# Patient Record
Sex: Female | Born: 1995 | Race: Black or African American | Hispanic: No | Marital: Single | Smoking: Never smoker
Health system: Southern US, Community
[De-identification: ages and names within clinical notes are randomized; demographics above are authoritative.]

## PROBLEM LIST (undated history)

## (undated) ENCOUNTER — Inpatient Hospital Stay (HOSPITAL_COMMUNITY): Payer: Self-pay

## (undated) DIAGNOSIS — T7840XA Allergy, unspecified, initial encounter: Secondary | ICD-10-CM

## (undated) DIAGNOSIS — Z789 Other specified health status: Secondary | ICD-10-CM

## (undated) HISTORY — PX: NO PAST SURGERIES: SHX2092

## (undated) HISTORY — DX: Allergy, unspecified, initial encounter: T78.40XA

---

## 2014-08-11 ENCOUNTER — Ambulatory Visit (INDEPENDENT_AMBULATORY_CARE_PROVIDER_SITE_OTHER): Payer: BLUE CROSS/BLUE SHIELD | Admitting: Internal Medicine

## 2014-08-11 VITALS — BP 110/62 | HR 66 | Temp 98.2°F | Resp 18 | Ht 66.0 in | Wt 130.0 lb

## 2014-08-11 DIAGNOSIS — R51 Headache: Secondary | ICD-10-CM | POA: Diagnosis not present

## 2014-08-11 DIAGNOSIS — R519 Headache, unspecified: Secondary | ICD-10-CM

## 2014-08-11 DIAGNOSIS — R197 Diarrhea, unspecified: Secondary | ICD-10-CM | POA: Diagnosis not present

## 2014-08-11 DIAGNOSIS — R5383 Other fatigue: Secondary | ICD-10-CM

## 2014-08-11 DIAGNOSIS — R1111 Vomiting without nausea: Secondary | ICD-10-CM | POA: Diagnosis not present

## 2014-08-11 DIAGNOSIS — A084 Viral intestinal infection, unspecified: Secondary | ICD-10-CM

## 2014-08-11 MED ORDER — ONDANSETRON HCL 8 MG PO TABS
8.0000 mg | ORAL_TABLET | Freq: Three times a day (TID) | ORAL | Status: DC | PRN
Start: 2014-08-11 — End: 2016-08-28

## 2014-08-11 MED ORDER — ACETAMINOPHEN 325 MG PO TABS
1000.0000 mg | ORAL_TABLET | Freq: Once | ORAL | Status: AC
  Administered 2014-08-11: 1000 mg via ORAL

## 2014-08-11 MED ORDER — ONDANSETRON 4 MG PO TBDP
8.0000 mg | ORAL_TABLET | Freq: Once | ORAL | Status: AC
  Administered 2014-08-11: 8 mg via ORAL

## 2014-08-11 NOTE — Progress Notes (Signed)
   Subjective:    Patient ID: Shannon Fischer, female    DOB: 06-13-1995, 19 y.o.   MRN: [id number]  HPI Scribed by Felix Ahmadiallie Fransen R.T.(R)  19 year old female here for emesis, diarrhea, and fatigue since 0500 today. Pt had a headache yesterday, but otherwise felt fine. Pt has vomited around 8-10 times. Pt has had diarrhea around 8-10 times as well since this morning. Pt has complaints of epigastric abdominal pain since this morning. Pt feels a lot of fatigue. Pt has a headache today. No fever or chills. No chest pain or SOB. Pt has not had any OTC medications. Not able to keep fluids down. Has not been around any sick contacts. No dizziness or faintness. No palpitations.  Edited by CWGMD  Review of Systems  Constitutional: Positive for appetite change and fatigue. Negative for fever and chills.  Respiratory: Negative for chest tightness and shortness of breath.   Cardiovascular: Negative for chest pain and palpitations.  Gastrointestinal: Positive for vomiting, abdominal pain and diarrhea.  Musculoskeletal: Negative.   Skin: Negative.   Neurological: Positive for headaches. Negative for dizziness and light-headedness.  Psychiatric/Behavioral: Negative.        Objective:   Physical Exam  Constitutional: She is oriented to person, place, and time. She appears well-developed and well-nourished. She is active and cooperative.  Non-toxic appearance. She does not have a sickly appearance. She does not appear ill. No distress.  HENT:  Head: Normocephalic.  Nose: Nose normal.  Mouth/Throat: Oropharynx is clear and moist.  Eyes: Conjunctivae and EOM are normal. Pupils are equal, round, and reactive to light. No scleral icterus.  Neck: Normal range of motion. Neck supple.  Cardiovascular: Normal rate, regular rhythm and normal heart sounds.  Exam reveals no gallop.   No murmur heard. Pulmonary/Chest: Effort normal.  Abdominal: Soft. Bowel sounds are normal. She exhibits no distension and no  mass. There is no tenderness. There is no rebound.  Neurological: She is alert and oriented to person, place, and time. No cranial nerve deficit. She exhibits normal muscle tone. Coordination normal.  Psychiatric: She has a normal mood and affect. Her behavior is normal.   Oral zofran odt 8mg  now Clear liquids/Has kept down and no more vomiting or diarrhea last 45 minutes.  No vomiting or diarrhea over last hour.       Assessment & Plan:  Viral gastroenteritis/BRAT diet Zofran/immodium prn

## 2014-08-11 NOTE — Patient Instructions (Addendum)
Food Choices to Help Relieve Diarrhea When you have diarrhea, the foods you eat and your eating habits are very important. Choosing the right foods and drinks can help relieve diarrhea. Also, because diarrhea can last up to 7 days, you need to replace lost fluids and electrolytes (such as sodium, potassium, and chloride) in order to help prevent dehydration.  WHAT GENERAL GUIDELINES DO I NEED TO FOLLOW?  Slowly drink 1 cup (8 oz) of fluid for each episode of diarrhea. If you are getting enough fluid, your urine will be clear or pale yellow.  Eat starchy foods. Some good choices include white rice, white toast, pasta, low-fiber cereal, baked potatoes (without the skin), saltine crackers, and bagels.  Avoid large servings of any cooked vegetables.  Limit fruit to two servings per day. A serving is  cup or 1 small piece.  Choose foods with less than 2 g of fiber per serving.  Limit fats to less than 8 tsp (38 g) per day.  Avoid fried foods.  Eat foods that have probiotics in them. Probiotics can be found in certain dairy products.  Avoid foods and beverages that may increase the speed at which food moves through the stomach and intestines (gastrointestinal tract). Things to avoid include:  High-fiber foods, such as dried fruit, raw fruits and vegetables, nuts, seeds, and whole grain foods.  Spicy foods and high-fat foods.  Foods and beverages sweetened with high-fructose corn syrup, honey, or sugar alcohols such as xylitol, sorbitol, and mannitol. WHAT FOODS ARE RECOMMENDED? Grains White rice. White, French, or pita breads (fresh or toasted), including plain rolls, buns, or bagels. White pasta. Saltine, soda, or graham crackers. Pretzels. Low-fiber cereal. Cooked cereals made with water (such as cornmeal, farina, or cream cereals). Plain muffins. Matzo. Melba toast. Zwieback.  Vegetables Potatoes (without the skin). Strained tomato and vegetable juices. Most well-cooked and canned  vegetables without seeds. Tender lettuce. Fruits Cooked or canned applesauce, apricots, cherries, fruit cocktail, grapefruit, peaches, pears, or plums. Fresh bananas, apples without skin, cherries, grapes, cantaloupe, grapefruit, peaches, oranges, or plums.  Meat and Other Protein Products Baked or boiled chicken. Eggs. Tofu. Fish. Seafood. Smooth peanut butter. Ground or well-cooked tender beef, ham, veal, lamb, pork, or poultry.  Dairy Plain yogurt, kefir, and unsweetened liquid yogurt. Lactose-free milk, buttermilk, or soy milk. Plain hard cheese. Beverages Sport drinks. Clear broths. Diluted fruit juices (except prune). Regular, caffeine-free sodas such as ginger ale. Water. Decaffeinated teas. Oral rehydration solutions. Sugar-free beverages not sweetened with sugar alcohols. Other Bouillon, broth, or soups made from recommended foods.  The items listed above may not be a complete list of recommended foods or beverages. Contact your dietitian for more options. WHAT FOODS ARE NOT RECOMMENDED? Grains Whole grain, whole wheat, bran, or rye breads, rolls, pastas, crackers, and cereals. Wild or brown rice. Cereals that contain more than 2 g of fiber per serving. Corn tortillas or taco shells. Cooked or dry oatmeal. Granola. Popcorn. Vegetables Raw vegetables. Cabbage, broccoli, Brussels sprouts, artichokes, baked beans, beet greens, corn, kale, legumes, peas, sweet potatoes, and yams. Potato skins. Cooked spinach and cabbage. Fruits Dried fruit, including raisins and dates. Raw fruits. Stewed or dried prunes. Fresh apples with skin, apricots, mangoes, pears, raspberries, and strawberries.  Meat and Other Protein Products Chunky peanut butter. Nuts and seeds. Beans and lentils. Bacon.  Dairy High-fat cheeses. Milk, chocolate milk, and beverages made with milk, such as milk shakes. Cream. Ice cream. Sweets and Desserts Sweet rolls, doughnuts, and sweet breads. Pancakes   and waffles. Fats and  Oils Butter. Cream sauces. Margarine. Salad oils. Plain salad dressings. Olives. Avocados.  Beverages Caffeinated beverages (such as coffee, tea, soda, or energy drinks). Alcoholic beverages. Fruit juices with pulp. Prune juice. Soft drinks sweetened with high-fructose corn syrup or sugar alcohols. Other Coconut. Hot sauce. Chili powder. Mayonnaise. Gravy. Cream-based or milk-based soups.  The items listed above may not be a complete list of foods and beverages to avoid. Contact your dietitian for more information. WHAT SHOULD I DO IF I BECOME DEHYDRATED? Diarrhea can sometimes lead to dehydration. Signs of dehydration include dark urine and dry mouth and skin. If you think you are dehydrated, you should rehydrate with an oral rehydration solution. These solutions can be purchased at pharmacies, retail stores, or online.  Drink -1 cup (120-240 mL) of oral rehydration solution each time you have an episode of diarrhea. If drinking this amount makes your diarrhea worse, try drinking smaller amounts more often. For example, drink 1-3 tsp (5-15 mL) every 5-10 minutes.  A general rule for staying hydrated is to drink 1-2 L of fluid per day. Talk to your health care provider about the specific amount you should be drinking each day. Drink enough fluids to keep your urine clear or pale yellow. Document Released: 08/19/2003 Document Revised: 06/03/2013 Document Reviewed: 04/21/2013 [facility] Patient Information 2015 [place], [place]. This information is not intended to replace advice given to you by your health care provider. Make sure you discuss any questions you have with your health care provider. Food Choices to Help Relieve Diarrhea When you have diarrhea, the foods you eat and your eating habits are very important. Choosing the right foods and drinks can help relieve diarrhea. Also, because diarrhea can last up to 7 days, you need to replace lost fluids and electrolytes (such as sodium, potassium, and  chloride) in order to help prevent dehydration.  WHAT GENERAL GUIDELINES DO I NEED TO FOLLOW?  Slowly drink 1 cup (8 oz) of fluid for each episode of diarrhea. If you are getting enough fluid, your urine will be clear or pale yellow.  Eat starchy foods. Some good choices include white rice, white toast, pasta, low-fiber cereal, baked potatoes (without the skin), saltine crackers, and bagels.  Avoid large servings of any cooked vegetables.  Limit fruit to two servings per day. A serving is  cup or 1 small piece.  Choose foods with less than 2 g of fiber per serving.  Limit fats to less than 8 tsp (38 g) per day.  Avoid fried foods.  Eat foods that have probiotics in them. Probiotics can be found in certain dairy products.  Avoid foods and beverages that may increase the speed at which food moves through the stomach and intestines (gastrointestinal tract). Things to avoid include:  High-fiber foods, such as dried fruit, raw fruits and vegetables, nuts, seeds, and whole grain foods.  Spicy foods and high-fat foods.  Foods and beverages sweetened with high-fructose corn syrup, honey, or sugar alcohols such as xylitol, sorbitol, and mannitol. WHAT FOODS ARE RECOMMENDED? Grains White rice. White, [place], or pita breads (fresh or toasted), including plain rolls, buns, or bagels. White pasta. Saltine, soda, or graham crackers. Pretzels. Low-fiber cereal. Cooked cereals made with water (such as cornmeal, farina, or cream cereals). Plain muffins. Matzo. Melba toast. Zwieback.  Vegetables Potatoes (without the skin). Strained tomato and vegetable juices. Most well-cooked and canned vegetables without seeds. Tender lettuce. Fruits Cooked or canned applesauce, apricots, cherries, fruit cocktail, grapefruit, peaches, pears,  or plums. Fresh bananas, apples without skin, cherries, grapes, cantaloupe, grapefruit, peaches, oranges, or plums.  Meat and Other Protein Products Baked or boiled chicken.  Eggs. Tofu. Fish. Seafood. Smooth peanut butter. Ground or well-cooked tender beef, ham, veal, lamb, pork, or poultry.  Dairy Plain yogurt, kefir, and unsweetened liquid yogurt. Lactose-free milk, buttermilk, or soy milk. Plain hard cheese. Beverages Sport drinks. Clear broths. Diluted fruit juices (except prune). Regular, caffeine-free sodas such as ginger ale. Water. Decaffeinated teas. Oral rehydration solutions. Sugar-free beverages not sweetened with sugar alcohols. Other Bouillon, broth, or soups made from recommended foods.  The items listed above may not be a complete list of recommended foods or beverages. Contact your dietitian for more options. WHAT FOODS ARE NOT RECOMMENDED? Grains Whole grain, whole wheat, bran, or rye breads, rolls, pastas, crackers, and cereals. Wild or brown rice. Cereals that contain more than 2 g of fiber per serving. Corn tortillas or taco shells. Cooked or dry oatmeal. Granola. Popcorn. Vegetables Raw vegetables. Cabbage, broccoli, Brussels sprouts, artichokes, baked beans, beet greens, corn, kale, legumes, peas, sweet potatoes, and yams. Potato skins. Cooked spinach and cabbage. Fruits Dried fruit, including raisins and dates. Raw fruits. Stewed or dried prunes. Fresh apples with skin, apricots, mangoes, pears, raspberries, and strawberries.  Meat and Other Protein Products Chunky peanut butter. Nuts and seeds. Beans and lentils. Tomasa Blase.  Dairy High-fat cheeses. Milk, chocolate milk, and beverages made with milk, such as milk shakes. Cream. Ice cream. Sweets and Desserts Sweet rolls, doughnuts, and sweet breads. Pancakes and waffles. Fats and Oils Butter. Cream sauces. Margarine. Salad oils. Plain salad dressings. Olives. Avocados.  Beverages Caffeinated beverages (such as coffee, tea, soda, or energy drinks). Alcoholic beverages. Fruit juices with pulp. Prune juice. Soft drinks sweetened with high-fructose corn syrup or sugar alcohols. Other Coconut.  Hot sauce. Chili powder. Mayonnaise. Gravy. Cream-based or milk-based soups.  The items listed above may not be a complete list of foods and beverages to avoid. Contact your dietitian for more information. WHAT SHOULD I DO IF I BECOME DEHYDRATED? Diarrhea can sometimes lead to dehydration. Signs of dehydration include dark urine and dry mouth and skin. If you think you are dehydrated, you should rehydrate with an oral rehydration solution. These solutions can be purchased at pharmacies, retail stores, or online.  Drink -1 cup (120-240 mL) of oral rehydration solution each time you have an episode of diarrhea. If drinking this amount makes your diarrhea worse, try drinking smaller amounts more often. For example, drink 1-3 tsp (5-15 mL) every 5-10 minutes.  A general rule for staying hydrated is to drink 1-2 L of fluid per day. Talk to your health care provider about the specific amount you should be drinking each day. Drink enough fluids to keep your urine clear or pale yellow. Document Released: 08/19/2003 Document Revised: 06/03/2013 Document Reviewed: 04/21/2013 [facility] Patient Information 2015 [place], [place]. This information is not intended to replace advice given to you by your health care provider. Make sure you discuss any questions you have with your health care provider. Viral Gastroenteritis Viral gastroenteritis is also known as stomach flu. This condition affects the stomach and intestinal tract. It can cause sudden diarrhea and vomiting. The illness typically lasts 3 to 8 days. Most people develop an immune response that eventually gets rid of the virus. While this natural response develops, the virus can make you quite ill. CAUSES  Many different viruses can cause gastroenteritis, such as rotavirus or noroviruses. You can catch one of these  viruses by consuming contaminated food or water. You may also catch a virus by sharing utensils or other personal items with an infected person or  by touching a contaminated surface. SYMPTOMS  The most common symptoms are diarrhea and vomiting. These problems can cause a severe loss of body fluids (dehydration) and a body salt (electrolyte) imbalance. Other symptoms may include:  Fever.  Headache.  Fatigue.  Abdominal pain. DIAGNOSIS  Your caregiver can usually diagnose viral gastroenteritis based on your symptoms and a physical exam. A stool sample may also be taken to test for the presence of viruses or other infections. TREATMENT  This illness typically goes away on its own. Treatments are aimed at rehydration. The most serious cases of viral gastroenteritis involve vomiting so severely that you are not able to keep fluids down. In these cases, fluids must be given through an intravenous line (IV). HOME CARE INSTRUCTIONS   Drink enough fluids to keep your urine clear or pale yellow. Drink small amounts of fluids frequently and increase the amounts as tolerated.  Ask your caregiver for specific rehydration instructions.  Avoid:  Foods high in sugar.  Alcohol.  Carbonated drinks.  Tobacco.  Juice.  Caffeine drinks.  Extremely hot or cold fluids.  Fatty, greasy foods.  Too much intake of anything at one time.  Dairy products until 24 to 48 hours after diarrhea stops.  You may consume probiotics. Probiotics are active cultures of beneficial bacteria. They may lessen the amount and number of diarrheal stools in adults. Probiotics can be found in yogurt with active cultures and in supplements.  Wash your hands well to avoid spreading the virus.  Only take over-the-counter or prescription medicines for pain, discomfort, or fever as directed by your caregiver. Do not give aspirin to children. Antidiarrheal medicines are not recommended.  Ask your caregiver if you should continue to take your regular prescribed and over-the-counter medicines.  Keep all follow-up appointments as directed by your caregiver. SEEK  IMMEDIATE MEDICAL CARE IF:   You are unable to keep fluids down.  You do not urinate at least once every 6 to 8 hours.  You develop shortness of breath.  You notice blood in your stool or vomit. This may look like coffee grounds.  You have abdominal pain that increases or is concentrated in one small area (localized).  You have persistent vomiting or diarrhea.  You have a fever.  The patient is a child younger than 3 months, and he or she has a fever.  The patient is a child older than 3 months, and he or she has a fever and persistent symptoms.  The patient is a child older than 3 months, and he or she has a fever and symptoms suddenly get worse.  The patient is a baby, and he or she has no tears when crying. MAKE SURE YOU:   Understand these instructions.  Will watch your condition.  Will get help right away if you are not doing well or get worse. Document Released: 05/29/2005 Document Revised: 08/21/2011 Document Reviewed: 03/15/2011 [facility] Patient Information 2015 [place], [place]. This information is not intended to replace advice given to you by your health care provider. Make sure you discuss any questions you have with your health care provider.

## 2015-03-26 ENCOUNTER — Ambulatory Visit (INDEPENDENT_AMBULATORY_CARE_PROVIDER_SITE_OTHER): Payer: BLUE CROSS/BLUE SHIELD | Admitting: Emergency Medicine

## 2015-03-26 VITALS — BP 104/62 | HR 68 | Temp 97.7°F | Resp 16 | Ht 65.0 in | Wt 129.2 lb

## 2015-03-26 DIAGNOSIS — J0140 Acute pansinusitis, unspecified: Secondary | ICD-10-CM

## 2015-03-26 MED ORDER — AMOXICILLIN-POT CLAVULANATE 875-125 MG PO TABS: 1.0000 | ORAL_TABLET | Freq: Two times a day (BID) | ORAL | Status: DC

## 2015-03-26 MED ORDER — PSEUDOEPHEDRINE-GUAIFENESIN ER 60-600 MG PO TB12: 1.0000 | ORAL_TABLET | Freq: Two times a day (BID) | ORAL | Status: AC

## 2015-03-26 NOTE — Progress Notes (Signed)
Subjective:  Patient ID: Shannon Fischer, female    DOB: 08-19-1995  Age: 19 y.o. MRN: [id number]  CC: Headache; Sore Throat; and Generalized Body Aches   HPI [name] presents  with sore throat. She said I was treated in the past for tonsillitis were felt this way. She has no fever or chills. She has some difficulty swallowing due to pain. She has postnasal drainage and nasal congestion. She spits out her green nasal drainage. She has no cough or nausea or vomiting. No other complaints. She has no improvement with over-the-counter medication  History [name] has a past medical history of Allergy.   She has no past surgical history on file.   Her  family history includes Cancer in her maternal grandfather and paternal grandmother; Diabetes in her paternal grandfather.  She   reports that she has never smoked. She does not have any smokeless tobacco history on file. She reports that she does not drink alcohol or use illicit drugs.  Outpatient Prescriptions Prior to Visit  Medication Sig Dispense Refill  . Norethin Ace-Eth Estrad-FE 1-20 MG-MCG(24) CHEW Chew by mouth.    . ondansetron (ZOFRAN) 8 MG tablet Take 1 tablet (8 mg total) by mouth every 8 (eight) hours as needed for nausea or vomiting. (Patient not taking: Reported on 03/26/2015) 20 tablet 0   No facility-administered medications prior to visit.    Social History   Social History  . Marital Status: Single    Spouse Name: N/A  . Number of Children: N/A  . Years of Education: N/A   Social History Main Topics  . Smoking status: Never Smoker   . Smokeless tobacco: None  . Alcohol Use: No  . Drug Use: No  . Sexual Activity: Not Asked   Other Topics Concern  . None   Social History Narrative     Review of Systems  Constitutional: Positive for fatigue. Negative for fever, chills and appetite change.  HENT: Positive for postnasal drip and sore throat. Negative for congestion, ear pain and sinus pressure.     Eyes: Negative for pain and redness.  Respiratory: Negative for cough, shortness of breath and wheezing.   Cardiovascular: Negative for leg swelling.  Gastrointestinal: Negative for nausea, vomiting, abdominal pain, diarrhea, constipation and blood in stool.  Endocrine: Negative for polyuria.  Genitourinary: Negative for dysuria, urgency, frequency and flank pain.  Musculoskeletal: Negative for gait problem.  Skin: Negative for rash.  Neurological: Negative for weakness and headaches.  Psychiatric/Behavioral: Negative for confusion and decreased concentration. The patient is not nervous/anxious.     Objective:  BP 104/62 mmHg  Pulse 68  Temp(Src) 97.7 F (36.5 C) (Oral)  Resp 16  Ht  (1.651 m)  Wt 129 lb 3.2 oz (58.605 kg)  BMI 21.50 kg/m2  LMP 02/26/2015  Physical Exam  Constitutional: She is oriented to person, place, and time. She appears well-developed and well-nourished. No distress.  HENT:  Head: Normocephalic and atraumatic.  Right Ear: External ear normal.  Left Ear: External ear normal.  Nose: Nose normal.  Eyes: Conjunctivae and EOM are normal. Pupils are equal, round, and reactive to light. No scleral icterus.  Neck: Normal range of motion. Neck supple. No tracheal deviation present.  Cardiovascular: Normal rate, regular rhythm and normal heart sounds.   Pulmonary/Chest: Effort normal. No respiratory distress. She has no wheezes. She has no rales.  Abdominal: She exhibits no mass. There is no tenderness. There is no rebound and no guarding.  Musculoskeletal: She  exhibits no edema.  Lymphadenopathy:    She has no cervical adenopathy.  Neurological: She is alert and oriented to person, place, and time. Coordination normal.  Skin: Skin is warm and dry. No rash noted.  Psychiatric: She has a normal mood and affect. Her behavior is normal.      Assessment & Plan:   Luanna ColeBriana was seen today for headache, sore throat and generalized body aches.  Diagnoses and  all orders for this visit:  Acute pansinusitis, recurrence not specified  Other orders -     amoxicillin-clavulanate (AUGMENTIN) 875-125 MG tablet; Take 1 tablet by mouth 2 (two) times daily. -     pseudoephedrine-guaifenesin (MUCINEX D) 60-600 MG 12 hr tablet; Take 1 tablet by mouth every 12 (twelve) hours.  I am having Ms. Raver start on amoxicillin-clavulanate and pseudoephedrine-guaifenesin. I am also having her maintain her Norethin Ace-Eth Estrad-FE and ondansetron.  Meds ordered this encounter  Medications  . amoxicillin-clavulanate (AUGMENTIN) 875-125 MG tablet    Sig: Take 1 tablet by mouth 2 (two) times daily.    Dispense:  20 tablet    Refill:  0  . pseudoephedrine-guaifenesin (MUCINEX D) 60-600 MG 12 hr tablet    Sig: Take 1 tablet by mouth every 12 (twelve) hours.    Dispense:  18 tablet    Refill:  0    Appropriate red flag conditions were discussed with the patient as well as actions that should be taken.  Patient expressed his understanding.  Follow-up: Return if symptoms worsen or fail to improve.  Carmelina DaneAnderson, Jeffery S, MD

## 2015-03-26 NOTE — Patient Instructions (Signed)

## 2016-05-09 DIAGNOSIS — N912 Amenorrhea, unspecified: Secondary | ICD-10-CM | POA: Diagnosis not present

## 2016-05-09 DIAGNOSIS — Z3201 Encounter for pregnancy test, result positive: Secondary | ICD-10-CM | POA: Diagnosis not present

## 2016-05-15 DIAGNOSIS — O021 Missed abortion: Secondary | ICD-10-CM | POA: Diagnosis not present

## 2016-05-31 DIAGNOSIS — O021 Missed abortion: Secondary | ICD-10-CM | POA: Diagnosis not present

## 2016-06-07 DIAGNOSIS — O021 Missed abortion: Secondary | ICD-10-CM | POA: Diagnosis not present

## 2016-06-12 NOTE — L&D Delivery Note (Signed)
Delivery Note Pt reached complete dilation +3 station and pushed well for about 30 minutes.  At 1:16 AM a healthy female was delivered via Vaginal, Spontaneous (Presentation: ROA  ).  APGAR: 9, 9; weight pending .   Placenta status: delivered spontaneously with fundal massage .  Cord:  with the following complications:short   Anesthesia:  epidural Episiotomy: None Lacerations: Periurethral abrasions repaired for hemostasis Suture Repair: 3.0 vicryl rapide Est. Blood Loss (mL): 175  Mom to postpartum.  Baby to Couplet care / Skin to Skin.  Oliver PilaKathy W Theodora Lalanne 05/24/2017, 1:42 AM

## 2016-06-21 ENCOUNTER — Ambulatory Visit (INDEPENDENT_AMBULATORY_CARE_PROVIDER_SITE_OTHER): Payer: BLUE CROSS/BLUE SHIELD | Admitting: Emergency Medicine

## 2016-06-21 VITALS — BP 96/72 | HR 87 | Temp 97.8°F | Resp 18 | Ht 65.0 in | Wt 138.0 lb

## 2016-06-21 DIAGNOSIS — J3489 Other specified disorders of nose and nasal sinuses: Secondary | ICD-10-CM

## 2016-06-21 DIAGNOSIS — J069 Acute upper respiratory infection, unspecified: Secondary | ICD-10-CM | POA: Diagnosis not present

## 2016-06-21 DIAGNOSIS — R0981 Nasal congestion: Secondary | ICD-10-CM | POA: Diagnosis not present

## 2016-06-21 MED ORDER — PSEUDOEPHEDRINE-GUAIFENESIN ER 60-600 MG PO TB12: 1.0000 | ORAL_TABLET | Freq: Two times a day (BID) | ORAL | 0 refills | Status: AC

## 2016-06-21 MED ORDER — CETIRIZINE HCL 10 MG PO TABS: 10.0000 mg | ORAL_TABLET | Freq: Every day | ORAL | 11 refills | Status: DC

## 2016-06-21 MED ORDER — PREDNISONE 20 MG PO TABS: 20.0000 mg | ORAL_TABLET | Freq: Every day | ORAL | 0 refills | Status: AC

## 2016-06-21 MED ORDER — TRIAMCINOLONE ACETONIDE 55 MCG/ACT NA AERO
2.0000 | INHALATION_SPRAY | Freq: Every day | NASAL | 2 refills | Status: DC
Start: 2016-06-21 — End: 2017-01-14

## 2016-06-21 NOTE — Progress Notes (Signed)
Shannon Fischer 21 y.o.   Chief Complaint  Patient presents with  . Epistaxis  . Nasal Congestion    runny nose  . Cough    HISTORY OF PRESENT ILLNESS: This is a 21 y.o. female complaining of runny nose, nasal congestion, and watery itchy eyes for 5-6 days. URI   This is a new problem. The current episode started in the past 7 days. The problem has been waxing and waning. There has been no fever. Associated symptoms include congestion, coughing, rhinorrhea, sinus pain, sneezing and a sore throat. Pertinent negatives include no abdominal pain, chest pain, diarrhea, dysuria, ear pain, headaches, nausea, rash, vomiting or wheezing. She has tried decongestant (Sudafed) for the symptoms. The treatment provided mild relief.     Prior to Admission medications   Medication Sig Start Date End Date Taking? Authorizing Provider  amoxicillin-clavulanate (AUGMENTIN) 875-125 MG tablet Take 1 tablet by mouth 2 (two) times daily. Patient not taking: Reported on 06/21/2016 03/26/15   Carmelina Dane, MD  Norethin Ace-Eth Estrad-FE 1-20 MG-MCG(24) CHEW Chew by mouth.    Historical Provider, MD  ondansetron (ZOFRAN) 8 MG tablet Take 1 tablet (8 mg total) by mouth every 8 (eight) hours as needed for nausea or vomiting. Patient not taking: Reported on 06/21/2016 08/11/14   Jonita Albee, MD    No Known Allergies  There are no active problems to display for this patient.   Past Medical History:  Diagnosis Date  . Allergy     History reviewed. No pertinent surgical history.  Social History   Social History  . Marital status: Single    Spouse name: N/A  . Number of children: N/A  . Years of education: N/A   Occupational History  . Not on file.   Social History Main Topics  . Smoking status: Never Smoker  . Smokeless tobacco: Never Used  . Alcohol use No  . Drug use: No  . Sexual activity: Not on file   Other Topics Concern  . Not on file   Social History Narrative  . No narrative  on file    Family History  Problem Relation Age of Onset  . Cancer Maternal Grandfather   . Cancer Paternal Grandmother   . Diabetes Paternal Grandfather      Review of Systems  Constitutional: Negative for chills, diaphoresis and fever.  HENT: Positive for congestion, nosebleeds (resolved), rhinorrhea, sinus pain, sneezing and sore throat. Negative for ear discharge and ear pain.   Eyes: Positive for redness (watery). Negative for blurred vision, double vision and discharge.  Respiratory: Positive for cough. Negative for shortness of breath and wheezing.   Cardiovascular: Negative for chest pain and palpitations.  Gastrointestinal: Negative for abdominal pain, diarrhea, nausea and vomiting.  Genitourinary: Negative for dysuria.  Musculoskeletal: Negative.   Skin: Negative for rash.  Neurological: Negative for dizziness and headaches.  Endo/Heme/Allergies: Negative.   Psychiatric/Behavioral: Negative.   All other systems reviewed and are negative.   Vitals:   06/21/16 1058  BP: 96/72  Pulse: 87  Resp: 18  Temp: 97.8 F (36.6 C)    Physical Exam  Constitutional: She is oriented to person, place, and time. She appears well-developed and well-nourished.  HENT:  Head: Normocephalic and atraumatic.  Right Ear: External ear normal.  Left Ear: External ear normal.  Nose: Mucosal edema present.  Mouth/Throat: Oropharynx is clear and moist. No oropharyngeal exudate or posterior oropharyngeal erythema.  Eyes: Conjunctivae and EOM are normal. Pupils are equal, round, and  reactive to light.  Neck: Normal range of motion. Neck supple.  Cardiovascular: Normal rate, regular rhythm and normal heart sounds.   Pulmonary/Chest: Effort normal and breath sounds normal.  Abdominal: Soft. Bowel sounds are normal.  Musculoskeletal: Normal range of motion.  Neurological: She is alert and oriented to person, place, and time.  Skin: Skin is warm and dry. Capillary refill takes less than 2  seconds.  Psychiatric: She has a normal mood and affect. Her behavior is normal.     ASSESSMENT & PLAN: Chrislynn was seen today for epistaxis, nasal congestion and cough.  Diagnoses and all orders for this visit:  Nasal congestion  Rhinorrhea  Acute upper respiratory infection  Other orders -     pseudoephedrine-guaifenesin (MUCINEX D) 60-600 MG 12 hr tablet; Take 1 tablet by mouth every 12 (twelve) hours. -     predniSONE (DELTASONE) 20 MG tablet; Take 1 tablet (20 mg total) by mouth daily with breakfast. -     triamcinolone (NASACORT AQ) 55 MCG/ACT AERO nasal inhaler; Place 2 sprays into the nose daily. -     cetirizine (ZYRTEC) 10 MG tablet; Take 1 tablet (10 mg total) by mouth daily.    Patient Instructions       IF you received an x-ray today, you will receive an invoice from [facility]. Please contact [facility] at [phone] with questions or concerns regarding your invoice.   IF you received labwork today, you will receive an invoice from [facility]. Please contact [facility] at [phone] with questions or concerns regarding your invoice.   Our billing staff will not be able to assist you with questions regarding bills from these companies.  You will be contacted with the lab results as soon as they are available. The fastest way to get your results is to activate your My Chart account. Instructions are located on the last page of this paperwork. If you have not heard from [place] regarding the results in 2 weeks, please contact this office.      Upper Respiratory Infection, Adult Most upper respiratory infections (URIs) are caused by a virus. A URI affects the nose, throat, and upper air passages. The most common type of URI is often called "the common cold." Follow these instructions at home:  Take medicines only as told by your doctor.  Gargle warm saltwater or take cough drops to comfort your throat as told by your doctor.  Use a warm mist  humidifier or inhale steam from a shower to increase air moisture. This may make it easier to breathe.  Drink enough fluid to keep your pee (urine) clear or pale yellow.  Eat soups and other clear broths.  Have a healthy diet.  Rest as needed.  Go back to work when your fever is gone or your doctor says it is okay.  You may need to stay home longer to avoid giving your URI to others.  You can also wear a face mask and wash your hands often to prevent spread of the virus.  Use your inhaler more if you have asthma.  Do not use any tobacco products, including cigarettes, chewing tobacco, or electronic cigarettes. If you need help quitting, ask your doctor. Contact a doctor if:  You are getting worse, not better.  Your symptoms are not helped by medicine.  You have chills.  You are getting more short of breath.  You have brown or red mucus.  You have yellow or brown discharge from your nose.  You have  pain in your face, especially when you bend forward.  You have a fever.  You have puffy (swollen) neck glands.  You have pain while swallowing.  You have white areas in the back of your throat. Get help right away if:  You have very bad or constant:  Headache.  Ear pain.  Pain in your forehead, behind your eyes, and over your cheekbones (sinus pain).  Chest pain.  You have long-lasting (chronic) lung disease and any of the following:  Wheezing.  Long-lasting cough.  Coughing up blood.  A change in your usual mucus.  You have a stiff neck.  You have changes in your:  Vision.  Hearing.  Thinking.  Mood. This information is not intended to replace advice given to you by your health care provider. Make sure you discuss any questions you have with your health care provider. Document Released: 11/15/2007 Document Revised: 01/30/2016 Document Reviewed: 09/03/2013 Elsevier Interactive Patient Education  2017 Elsevier Inc.      Edwina BarthMiguel Valinda Fedie,  MD [facility]

## 2016-06-21 NOTE — Patient Instructions (Addendum)
     IF you received an x-ray today, you will receive an invoice from [facility]. Please contact  Radiology at [phone] with questions or concerns regarding your invoice.   IF you received labwork today, you will receive an invoice from [facility]. Please contact [facility] at [phone] with questions or concerns regarding your invoice.   Our billing staff will not be able to assist you with questions regarding bills from these companies.  You will be contacted with the lab results as soon as they are available. The fastest way to get your results is to activate your My Chart account. Instructions are located on the last page of this paperwork. If you have not heard from us regarding the results in 2 weeks, please contact this office.      Upper Respiratory Infection, Adult Most upper respiratory infections (URIs) are caused by a virus. A URI affects the nose, throat, and upper air passages. The most common type of URI is often called "the common cold." Follow these instructions at home:  Take medicines only as told by your doctor.  Gargle warm saltwater or take cough drops to comfort your throat as told by your doctor.  Use a warm mist humidifier or inhale steam from a shower to increase air moisture. This may make it easier to breathe.  Drink enough fluid to keep your pee (urine) clear or pale yellow.  Eat soups and other clear broths.  Have a healthy diet.  Rest as needed.  Go back to work when your fever is gone or your doctor says it is okay.  You may need to stay home longer to avoid giving your URI to others.  You can also wear a face mask and wash your hands often to prevent spread of the virus.  Use your inhaler more if you have asthma.  Do not use any tobacco products, including cigarettes, chewing tobacco, or electronic cigarettes. If you need help quitting, ask your doctor. Contact a doctor if:  You are getting worse, not better.  Your  symptoms are not helped by medicine.  You have chills.  You are getting more short of breath.  You have brown or red mucus.  You have yellow or brown discharge from your nose.  You have pain in your face, especially when you bend forward.  You have a fever.  You have puffy (swollen) neck glands.  You have pain while swallowing.  You have white areas in the back of your throat. Get help right away if:  You have very bad or constant:  Headache.  Ear pain.  Pain in your forehead, behind your eyes, and over your cheekbones (sinus pain).  Chest pain.  You have long-lasting (chronic) lung disease and any of the following:  Wheezing.  Long-lasting cough.  Coughing up blood.  A change in your usual mucus.  You have a stiff neck.  You have changes in your:  Vision.  Hearing.  Thinking.  Mood. This information is not intended to replace advice given to you by your health care provider. Make sure you discuss any questions you have with your health care provider. Document Released: 11/15/2007 Document Revised: 01/30/2016 Document Reviewed: 09/03/2013 Elsevier Interactive Patient Education  2017 Elsevier Inc.  

## 2016-06-26 ENCOUNTER — Ambulatory Visit: Payer: BLUE CROSS/BLUE SHIELD

## 2016-07-07 DIAGNOSIS — O021 Missed abortion: Secondary | ICD-10-CM | POA: Diagnosis not present

## 2016-08-28 ENCOUNTER — Encounter: Payer: Self-pay | Admitting: Family Medicine

## 2016-08-28 ENCOUNTER — Ambulatory Visit (INDEPENDENT_AMBULATORY_CARE_PROVIDER_SITE_OTHER): Payer: BLUE CROSS/BLUE SHIELD | Admitting: Family Medicine

## 2016-08-28 VITALS — BP 105/70 | HR 85 | Temp 98.6°F | Resp 16 | Ht 65.0 in | Wt 135.0 lb

## 2016-08-28 DIAGNOSIS — J029 Acute pharyngitis, unspecified: Secondary | ICD-10-CM

## 2016-08-28 DIAGNOSIS — J301 Allergic rhinitis due to pollen: Secondary | ICD-10-CM

## 2016-08-28 LAB — POCT RAPID STREP A (OFFICE): RAPID STREP A SCREEN: NEGATIVE

## 2016-08-28 MED ORDER — PSEUDOEPHEDRINE HCL ER 120 MG PO TB12: 120.0000 mg | ORAL_TABLET | Freq: Two times a day (BID) | ORAL | 0 refills | Status: DC

## 2016-08-28 MED ORDER — OXYMETAZOLINE HCL 0.05 % NA SOLN: 1.0000 | Freq: Two times a day (BID) | NASAL | 0 refills | Status: DC

## 2016-08-28 NOTE — Patient Instructions (Addendum)
Start Afrin 1 spray into each snare twice a day for 3 days. Never use this more than 5 days or it will ultimately make her symptoms worse. Start the Sudafed twice a day. If it keeps you awake or makes you jittery you can change it to just in the morning. It would probably be a good idea to go ahead and restart your Zyrtec and Nasacort nasal spray. 2 sprays to each naris every night before bed for the next month so that this does not recur.    Gargle with warm salt water twice a day. Poor and as much salt as you can into the warm water until will no longer dissolve.  Hot tea with honey and lemon will also work to soothe your throat and give you as much relief as any over-the-counter medication.  Hot showers or breathing in steam may help loosen the congestion.  Using a netti pot or sinus rinse is also likely to help you feel better and keep this from progressing.   If no improvement or you are getting worse, come back as you might need a course of steroids but hopefully with all of the above, you can avoid it.     IF you received an x-ray today, you will receive an invoice from [facility]. Please contact [facility] at [phone] with questions or concerns regarding your invoice.   IF you received labwork today, you will receive an invoice from [facility]. Please contact [facility] at [phone] with questions or concerns regarding your invoice.   Our billing staff will not be able to assist you with questions regarding bills from these companies.  You will be contacted with the lab results as soon as they are available. The fastest way to get your results is to activate your My Chart account. Instructions are located on the last page of this paperwork. If you have not heard from [place] regarding the results in 2 weeks, please contact this office.     Nasal Allergies Nasal allergies are a reaction to allergens in the air. Allergens are particles in the air that cause your body to  have an allergic reaction. Nasal allergies are not passed from person to person (are not contagious). They cannot be cured, but they can be controlled. What are the causes? Seasonal nasal allergies (hay fever) are caused by pollen allergens that come from grasses, trees, and weeds. Year-round nasal allergies (perennial allergic rhinitis) are caused by allergens such as house dust mites, pet dander, and mold spores. What increases the risk? The following factors may make you more likely to develop this condition:  Having certain health conditions. These include:  Other types of allergies, such as food allergies.  Asthma.  Eczema.  Having a close relative who has allergies or asthma.  Exposure to house dust, pollen, dander, or other allergens at home or at work.  Exposure to air pollution or secondhand smoke when you were a child. What are the signs or symptoms? Symptoms of this condition include:  Sneezing.  Runny nose or stuffy nose (congestion).  Watery (tearing) eyes.  Itchy eyes, nose, mouth, throat, skin, or other area.  Sore throat.  Headache.  Decreased sense of smell or taste.  Fatigue. This may occur if you have trouble sleeping due to allergies.  Swollen eyelids. How is this diagnosed? This condition is diagnosed with a medical history and physical exam. Allergy testing may be done to determine exactly what triggers your nasal allergies. How is this treated? There  is no cure for nasal allergies. Treatment focuses on controlling your symptoms, and it may include:  Medicines that block allergy symptoms. These may include allergy shots, nasal sprays, and oral antihistamines.  Avoiding the allergen. Follow these instructions at home:  Avoid the allergen that is causing your symptoms, if possible.  Keep windows closed. If possible, use air conditioning when pollen counts are high.  Do not use fans in your home.  Do not hang clothes outside to dry.  Wear  sunglasses to keep pollen out of your eyes.  Wash your hands right away after you touch household pets.  Take over-the-counter and prescription medicines only as told by your health care provider.  Keep all follow-up visits as told by your health care provider. This is important. Contact a health care provider if:  You have a fever.  You develop a cough that does not go away (is persistent).  You start to wheeze.  Your symptoms do not improve with treatment.  You have thick nasal discharge.  You start to have nosebleeds. Get help right away if:  Your tongue or your lips are swollen.  You have trouble breathing.  You feel light-headed or you feel like you are going to faint.  You have cold sweats. This information is not intended to replace advice given to you by your health care provider. Make sure you discuss any questions you have with your health care provider. Document Released: 05/29/2005 Document Revised: 11/01/2015 Document Reviewed: 12/09/2014 Elsevier Interactive Patient Education  2017 [employer]

## 2016-08-28 NOTE — Progress Notes (Signed)
Subjective:    Patient ID: Shannon Fischer, female    DOB: [birth year], 21 y.o.   MRN: [id number] Chief Complaint  Patient presents with  . Sore Throat    x 1 wk  . Sinusitis    x 1 wk    HPI  Started 1 wk prior and sig worsened 3d ago.  Initially mucinex helped but no longer. Using vics vaporub qhs to help breathe.  She has a problem with getting this illness recurrently - last 2 mos ago.  +pharyngitis. No f/c.  No sig sinus pressure/pain, ears normal. Sig rhinitis prod of greens/yellow congestion of moderate amount. Pharyngitis started several days ago and is worsening with mild laryngitis.   A little cough. Sleeping well.  Normal appetite, nml GI/GU.  SHe is not taking zyrtec and nasonex (was put on this last time which helped alittle bit, she still has some at home.  Depression screen [facility] 2/9 08/28/2016 06/21/2016 03/26/2015  Decreased Interest 0 0 0  Down, Depressed, Hopeless 0 0 0  PHQ - 2 Score 0 0 0   Past Medical History:  Diagnosis Date  . Allergy    No past surgical history on file. Current Outpatient Prescriptions on File Prior to Visit  Medication Sig Dispense Refill  . cetirizine (ZYRTEC) 10 MG tablet Take 1 tablet (10 mg total) by mouth daily. 10 tablet 11  . Norethin Ace-Eth Estrad-FE 1-20 MG-MCG(24) CHEW Chew by mouth.    . triamcinolone (NASACORT AQ) 55 MCG/ACT AERO nasal inhaler Place 2 sprays into the nose daily. 1 Inhaler 2   No current facility-administered medications on file prior to visit.    No Known Allergies Family History  Problem Relation Age of Onset  . Cancer Maternal Grandfather   . Cancer Paternal Grandmother   . Diabetes Paternal Grandfather    Social History   Social History  . Marital status: Single    Spouse name: N/A  . Number of children: N/A  . Years of education: N/A   Social History Main Topics  . Smoking status: Never Smoker  . Smokeless tobacco: Never Used  . Alcohol use No  . Drug use: No  . Sexual activity: Not Asked    Other Topics Concern  . None   Social History Narrative  . None      Review of Systems  Constitutional: Positive for activity change and fatigue. Negative for appetite change, chills, diaphoresis and fever.  HENT: Positive for congestion, hearing loss, postnasal drip, rhinorrhea, sore throat and voice change. Negative for ear discharge, ear pain, facial swelling, sinus pain, sinus pressure and trouble swallowing.   Respiratory: Positive for cough. Negative for chest tightness, shortness of breath and wheezing.   Cardiovascular: Negative for chest pain and palpitations.  Gastrointestinal: Negative for abdominal pain, constipation, diarrhea, nausea and vomiting.  Genitourinary: Negative for decreased urine volume, difficulty urinating, dysuria and pelvic pain.  Psychiatric/Behavioral: Negative for sleep disturbance.       Objective:   Physical Exam  Constitutional: She is oriented to person, place, and time. She appears well-developed and well-nourished. She appears lethargic. She appears ill. No distress.  HENT:  Head: Normocephalic and atraumatic.  Right Ear: External ear and ear canal normal. Tympanic membrane is retracted. A middle ear effusion is present.  Left Ear: External ear and ear canal normal. Tympanic membrane is retracted. A middle ear effusion is present.  Nose: Mucosal edema and rhinorrhea present. Right sinus exhibits maxillary sinus tenderness. Left sinus exhibits maxillary sinus  tenderness.  Mouth/Throat: Uvula is midline and mucous membranes are normal. Posterior oropharyngeal erythema present. No oropharyngeal exudate, posterior oropharyngeal edema or tonsillar abscesses.  Eyes: Conjunctivae are normal. Right eye exhibits no discharge. Left eye exhibits no discharge. No scleral icterus.  Neck: Normal range of motion. Neck supple.  Cardiovascular: Normal rate, regular rhythm, normal heart sounds and intact distal pulses.   Pulmonary/Chest: Effort normal and  breath sounds normal.  Lymphadenopathy:       Head (right side): Submandibular adenopathy present. No preauricular and no posterior auricular adenopathy present.       Head (left side): Submandibular adenopathy present. No preauricular and no posterior auricular adenopathy present.    She has no cervical adenopathy.       Right: No supraclavicular adenopathy present.       Left: No supraclavicular adenopathy present.  Neurological: She is oriented to person, place, and time. She appears lethargic.  Skin: Skin is warm and dry. She is not diaphoretic. No erythema.  Psychiatric: She has a normal mood and affect. Her behavior is normal.         BP 105/70   Pulse 85   Temp 98.6 F (37 C) (Oral)   Resp 16   Ht 5\' 5"  (1.651 m)   Wt 135 lb (61.2 kg)   LMP 08/16/2016   SpO2 100%   BMI 22.47 kg/m   Results for orders placed or performed in visit on 08/28/16  POCT rapid strep A  Result Value Ref Range   Rapid Strep A Screen Negative Negative    Assessment & Plan:   1. Hay fever   2. Acute pharyngitis, unspecified etiology   try afrin x 3d, sudafed bid. Restart zyrtec and nasacort. Gargle with salt water, hot tea w/ honey and lemon Steam treatments, netti pot. If worsening, RTC to consider need for steroids +/- abx.  Orders Placed This Encounter  Procedures  . Culture, Group A Strep    Order Specific Question:   Source    Answer:   oropharynx  . POCT rapid strep A    Meds ordered this encounter  Medications  . pseudoephedrine (SUDAFED 12 HOUR) 120 MG 12 hr tablet    Sig: Take 1 tablet (120 mg total) by mouth 2 (two) times daily.    Dispense:  30 tablet    Refill:  0  . oxymetazoline (AFRIN NASAL SPRAY) 0.05 % nasal spray    Sig: Place 1 spray into both nostrils 2 (two) times daily. x3d only    Dispense:  14.7 mL    Refill:  0     Norberto SorensonEva Lavoris Sparling, M.D.  [facility]  [place] [address] [place], [place] [address] [phone] phone [phone]  fax  08/30/16 1:39 AM

## 2016-08-31 LAB — CULTURE, GROUP A STREP: Strep A Culture: NEGATIVE

## 2016-09-08 ENCOUNTER — Encounter: Payer: Self-pay | Admitting: *Deleted

## 2016-10-09 DIAGNOSIS — Z3201 Encounter for pregnancy test, result positive: Secondary | ICD-10-CM | POA: Diagnosis not present

## 2016-10-09 DIAGNOSIS — N912 Amenorrhea, unspecified: Secondary | ICD-10-CM | POA: Diagnosis not present

## 2016-10-23 DIAGNOSIS — Z3A09 9 weeks gestation of pregnancy: Secondary | ICD-10-CM | POA: Diagnosis not present

## 2016-10-23 DIAGNOSIS — Z1151 Encounter for screening for human papillomavirus (HPV): Secondary | ICD-10-CM | POA: Diagnosis not present

## 2016-10-23 DIAGNOSIS — Z3689 Encounter for other specified antenatal screening: Secondary | ICD-10-CM | POA: Diagnosis not present

## 2016-10-23 DIAGNOSIS — Z124 Encounter for screening for malignant neoplasm of cervix: Secondary | ICD-10-CM | POA: Diagnosis not present

## 2016-10-23 DIAGNOSIS — Z368A Encounter for antenatal screening for other genetic defects: Secondary | ICD-10-CM | POA: Diagnosis not present

## 2016-10-23 DIAGNOSIS — Z113 Encounter for screening for infections with a predominantly sexual mode of transmission: Secondary | ICD-10-CM | POA: Diagnosis not present

## 2016-10-23 DIAGNOSIS — Z3481 Encounter for supervision of other normal pregnancy, first trimester: Secondary | ICD-10-CM | POA: Diagnosis not present

## 2016-10-23 DIAGNOSIS — O26891 Other specified pregnancy related conditions, first trimester: Secondary | ICD-10-CM | POA: Diagnosis not present

## 2016-11-13 DIAGNOSIS — Z3682 Encounter for antenatal screening for nuchal translucency: Secondary | ICD-10-CM | POA: Diagnosis not present

## 2016-11-13 DIAGNOSIS — Z3A12 12 weeks gestation of pregnancy: Secondary | ICD-10-CM | POA: Diagnosis not present

## 2016-12-28 DIAGNOSIS — Z363 Encounter for antenatal screening for malformations: Secondary | ICD-10-CM | POA: Diagnosis not present

## 2016-12-28 DIAGNOSIS — Z3A18 18 weeks gestation of pregnancy: Secondary | ICD-10-CM | POA: Diagnosis not present

## 2017-01-14 ENCOUNTER — Encounter (HOSPITAL_COMMUNITY): Payer: Self-pay

## 2017-01-14 ENCOUNTER — Inpatient Hospital Stay (HOSPITAL_COMMUNITY): Payer: BLUE CROSS/BLUE SHIELD

## 2017-01-14 ENCOUNTER — Inpatient Hospital Stay (HOSPITAL_COMMUNITY)
Admission: AD | Admit: 2017-01-14 | Discharge: 2017-01-14 | Disposition: A | Discharge status: Home / Self Care | Payer: BLUE CROSS/BLUE SHIELD | Source: Ambulatory Visit | Attending: Obstetrics and Gynecology | Admitting: Obstetrics and Gynecology

## 2017-01-14 DIAGNOSIS — N930 Postcoital and contact bleeding: Secondary | ICD-10-CM | POA: Diagnosis not present

## 2017-01-14 DIAGNOSIS — O4692 Antepartum hemorrhage, unspecified, second trimester: Secondary | ICD-10-CM | POA: Diagnosis not present

## 2017-01-14 DIAGNOSIS — O9989 Other specified diseases and conditions complicating pregnancy, childbirth and the puerperium: Secondary | ICD-10-CM

## 2017-01-14 DIAGNOSIS — Z3A20 20 weeks gestation of pregnancy: Secondary | ICD-10-CM | POA: Insufficient documentation

## 2017-01-14 HISTORY — DX: Other specified health status: Z78.9

## 2017-01-14 LAB — ABO/RH: ABO/RH(D): O POS

## 2017-01-14 LAB — URINALYSIS, ROUTINE W REFLEX MICROSCOPIC
Bacteria, UA: NONE SEEN
Bilirubin Urine: NEGATIVE
Glucose, UA: NEGATIVE mg/dL
Ketones, ur: NEGATIVE mg/dL
Leukocytes, UA: NEGATIVE
NITRITE: NEGATIVE
PH: 7 (ref 5.0–8.0)
Protein, ur: NEGATIVE mg/dL
SPECIFIC GRAVITY, URINE: 1.017 (ref 1.005–1.030)

## 2017-01-14 LAB — WET PREP, GENITAL
Clue Cells Wet Prep HPF POC: NONE SEEN
SPERM: NONE SEEN
Trich, Wet Prep: NONE SEEN
Yeast Wet Prep HPF POC: NONE SEEN

## 2017-01-14 LAB — CBC
HEMATOCRIT: 33 % — AB (ref 36.0–46.0)
HEMOGLOBIN: 11 g/dL — AB (ref 12.0–15.0)
MCH: 28.4 pg (ref 26.0–34.0)
MCHC: 33.3 g/dL (ref 30.0–36.0)
MCV: 85.1 fL (ref 78.0–100.0)
Platelets: 246 10*3/uL (ref 150–400)
RBC: 3.88 MIL/uL (ref 3.87–5.11)
RDW: 13.7 % (ref 11.5–15.5)
WBC: 10.1 10*3/uL (ref 4.0–10.5)

## 2017-01-14 IMAGING — US US MFM OB LIMITED
1 series · 15 of 20 positions shown · non-contrast
Comparison: none

[Series 1: us mfm ob limited · 20 acquisitions, 15 frames shown]
[im 1/20]
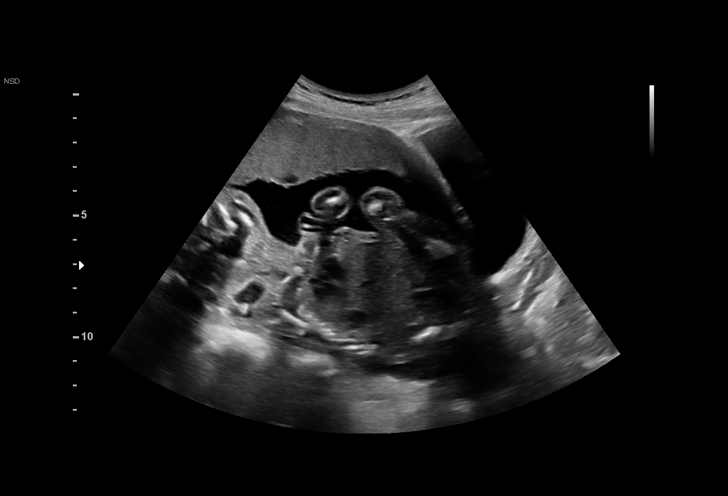
[im 3/20]
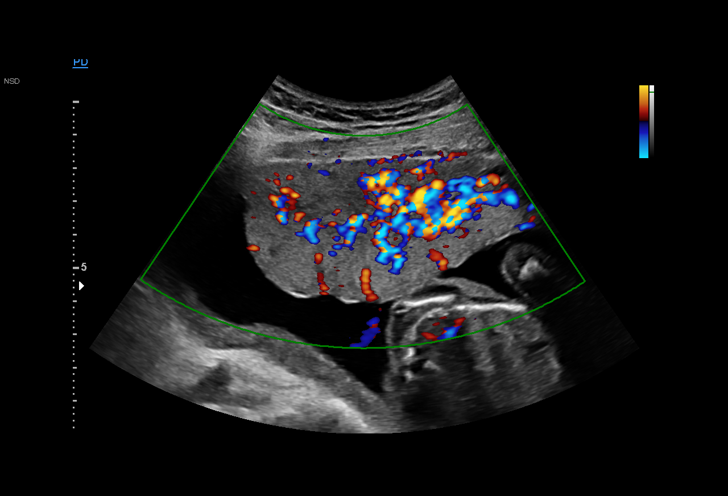
[im 4/20]
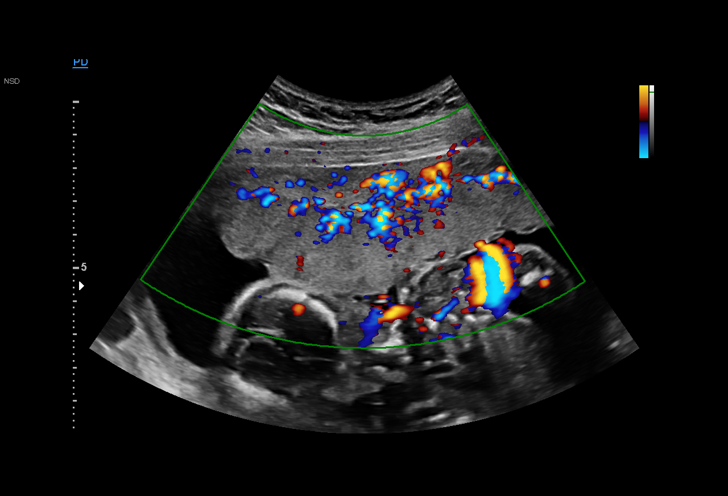
[im 5/20]
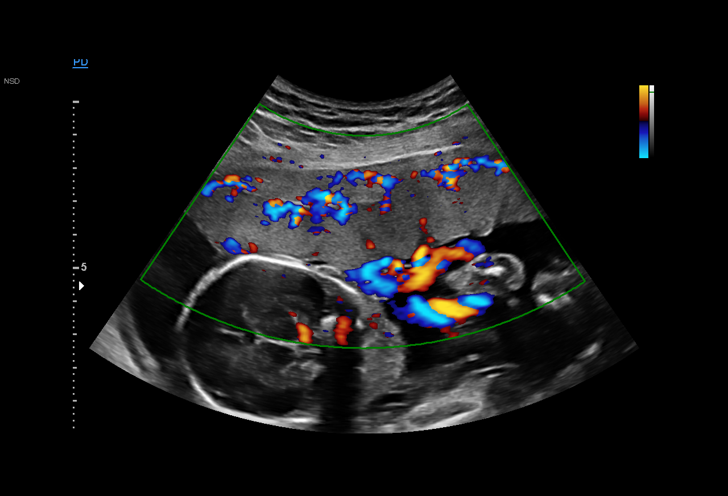
[im 7/20]
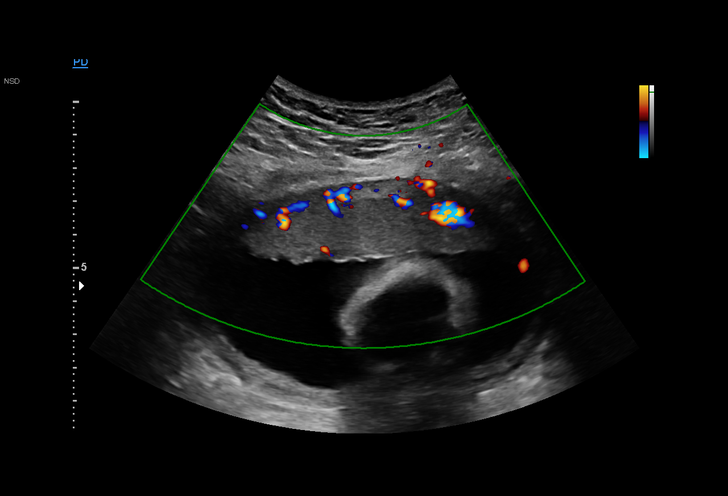
[im 8/20]
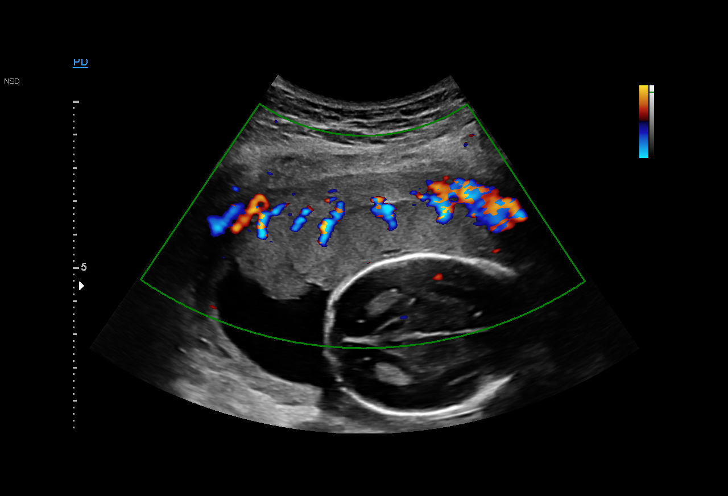
[im 9/20]
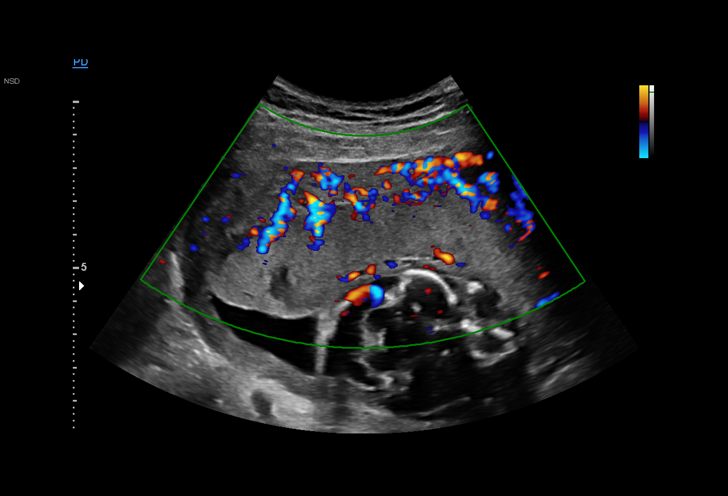
[im 11/20]
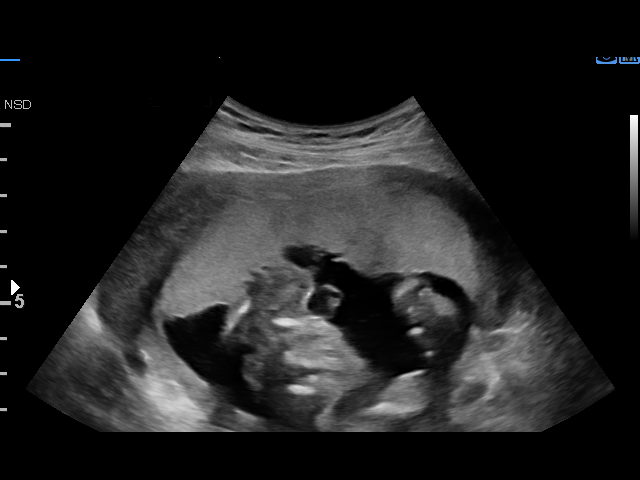
[im 12/20]
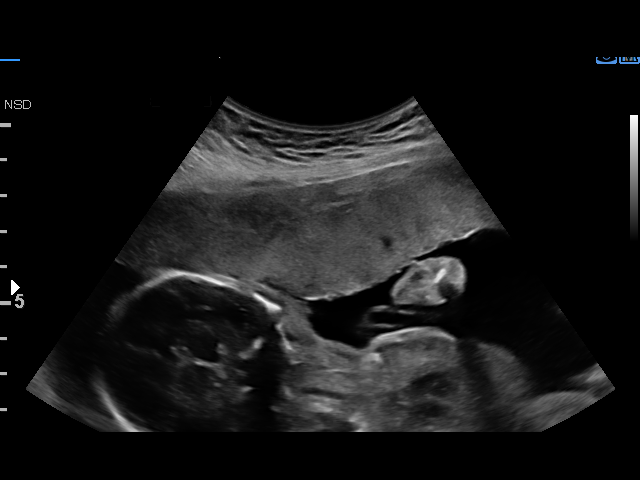
[im 13/20]
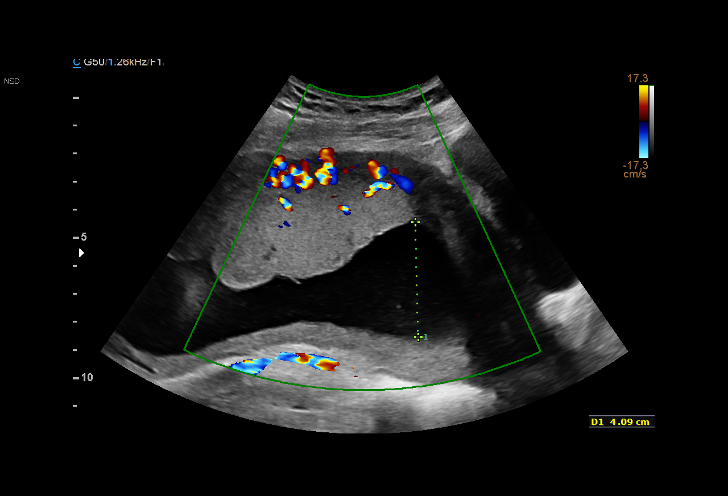
[im 15/20]
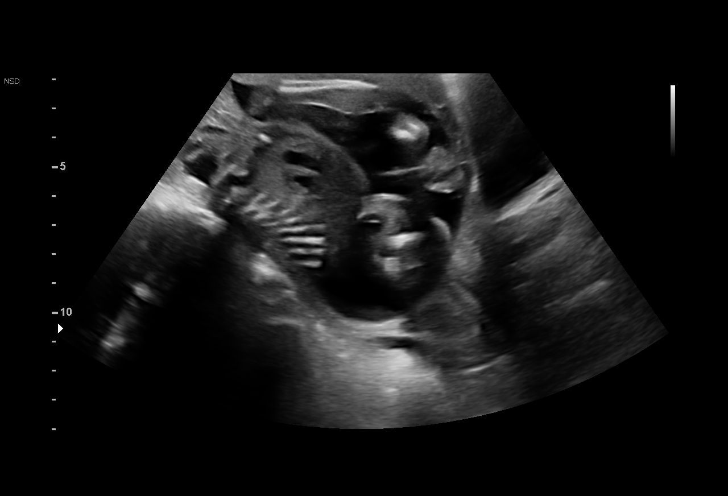
[im 16/20]
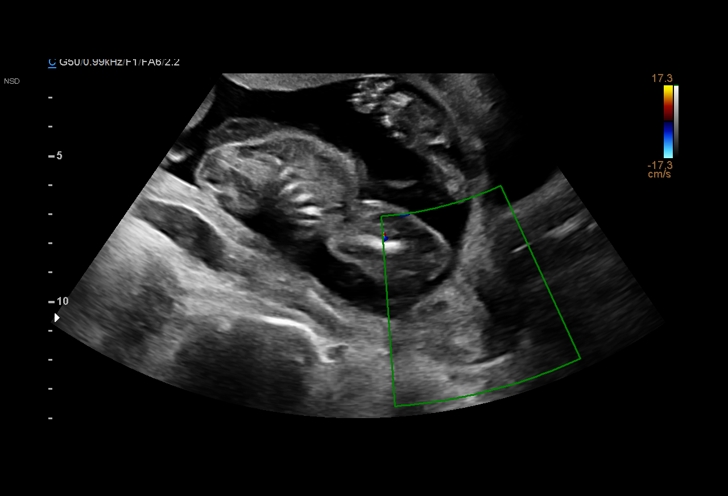
[im 17/20]
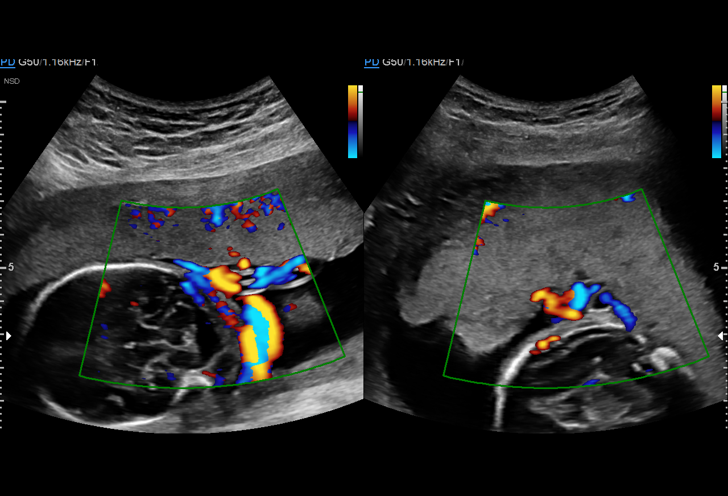
[im 19/20]
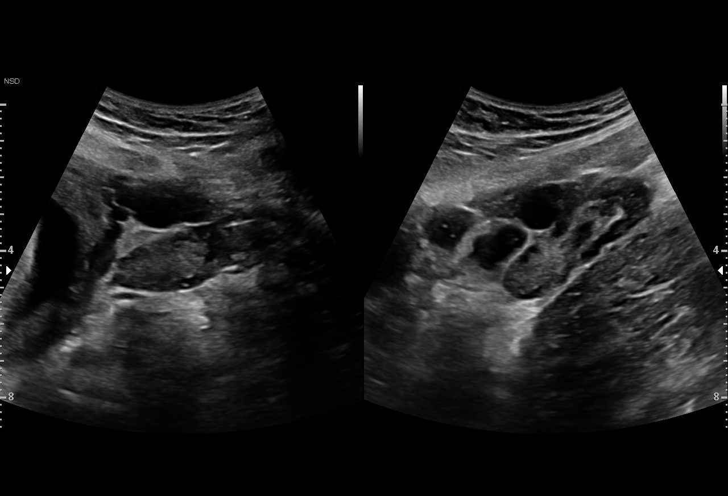
[im 20/20]
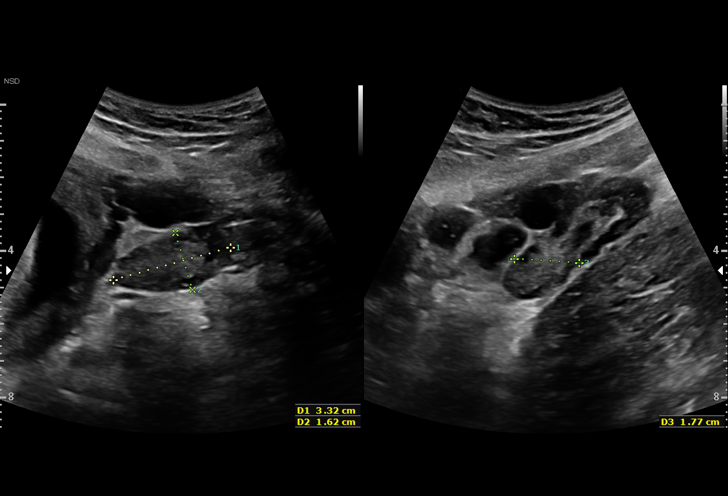

[15 of 20 positions shown; findings below may reference images not displayed]

Attending:        LECTURER      Secondary Phy.:   LECTURER Nursing-
MAU/Triage

Indications

20 weeks gestation of pregnancy
Vaginal bleeding in pregnancy, second          [S5]
trimester
Fetal Evaluation

Num Of Fetuses:     1
Fetal Heart         165
Rate(bpm):
Cardiac Activity:   Observed
Presentation:       Breech
Placenta:           Anterior, above cervical os
P. Cord Insertion:  Visualized

Amniotic Fluid
AFI FV:      Subjectively within normal limits

Largest Pocket(cm)
4.1
Gestational Age

Best:          20w 6d    Det. By:   Previous Ultrasound      EDD:   [DATE]
Cervix Uterus Adnexa

Cervix
Length:            3.1  cm.
Normal appearance by transabdominal scan.
Left Ovary
Within normal limits.

Right Ovary
Not visualized.

Adnexa:       No abnormality visualized.
Impression

Single living intrauterine pregnancy at 20 weeks 6 days.
Normal amniotic fluid volume.
No evidence of placenta previa or abruption.
Recommendations

Follow-up ultrasounds as clinically indicated.

## 2017-01-14 NOTE — MAU Note (Signed)
Patient presents with some vaginal bleeding since this morning, noticing when wiping after using the bathroom, recent intercourse.

## 2017-01-14 NOTE — MAU Provider Note (Signed)
Chief Complaint: Vaginal Bleeding   First Provider Initiated Contact with Patient 01/14/17 1714      SUBJECTIVE HPI: Shannon Fischer is a 21 y.o. G2P0010 at 7466w6d by LMP who presents to maternity admissions reporting onset of bright red bleeding after intercourse this morning.  The bleeding continued all day and is dark red, like a light period requiring tissues/pantyliner but no pad, and unchanged in amount since onset.  She called her office and was told to monitor the bleeding but she became worried and came to MAU for evaluation. She has not tried any treatments, nothing makes the symptom better or worse.  There are no associated symptoms. She denies vaginal bleeding, vaginal itching/burning, urinary symptoms, h/a, dizziness, n/v, or fever/chills.     HPI  Past Medical History:  Diagnosis Date  . Allergy   . Medical history non-contributory    Past Surgical History:  Procedure Laterality Date  . NO PAST SURGERIES     Social History   Social History  . Marital status: Single    Spouse name: N/A  . Number of children: N/A  . Years of education: N/A   Occupational History  . Not on file.   Social History Main Topics  . Smoking status: Never Smoker  . Smokeless tobacco: Never Used  . Alcohol use No  . Drug use: No  . Sexual activity: Yes   Other Topics Concern  . Not on file   Social History Narrative  . No narrative on file   No current facility-administered medications on file prior to encounter.    Current Outpatient Prescriptions on File Prior to Encounter  Medication Sig Dispense Refill  . cetirizine (ZYRTEC) 10 MG tablet Take 1 tablet (10 mg total) by mouth daily. 10 tablet 11   No Known Allergies  ROS:  Review of Systems  Constitutional: Negative for chills, fatigue and fever.  Respiratory: Negative for shortness of breath.   Cardiovascular: Negative for chest pain.  Gastrointestinal: Negative for nausea and vomiting.  Genitourinary: Positive for  vaginal bleeding. Negative for difficulty urinating, dysuria, flank pain, pelvic pain, vaginal discharge and vaginal pain.  Neurological: Negative for dizziness and headaches.  Psychiatric/Behavioral: Negative.      I have reviewed patient's Past Medical Hx, Surgical Hx, Family Hx, Social Hx, medications and allergies.   Physical Exam   Patient Vitals for the past 24 hrs:  BP Temp Temp src Pulse Resp Height Weight  01/14/17 1921 (!) 106/59 98.4 F (36.9 C) Oral 88 16 - -  01/14/17 1609 123/74 99.2 F (37.3 C) - (!) 101 18 5\' 5"  (1.651 m) 148 lb 1.9 oz (67.2 kg)   Constitutional: Well-developed, well-nourished female in no acute distress.  Cardiovascular: normal rate Respiratory: normal effort GI: Abd soft, non-tender. Pos BS x 4 MS: Extremities nontender, no edema, normal ROM Neurologic: Alert and oriented x 4.  GU: Neg CVAT.  PELVIC EXAM: Cervix pink, visually closed, without lesion, moderate amount of dark red bleeding requiring 1 large cotton swab to visualize cervix, vaginal walls and external genitalia normal  Dilation: Closed Effacement (%): 50 Cervical Position: Posterior Exam by:: l leftwich-kirby cnm  FHT 160 by doppler  LAB RESULTS Results for orders placed or performed during the hospital encounter of 01/14/17 (from the past 24 hour(s))  Urinalysis, Routine w reflex microscopic     Status: Abnormal   Collection Time: 01/14/17  4:10 PM  Result Value Ref Range   Color, Urine YELLOW YELLOW   APPearance CLEAR CLEAR  Specific Gravity, Urine 1.017 1.005 - 1.030   pH 7.0 5.0 - 8.0   Glucose, UA NEGATIVE NEGATIVE mg/dL   Hgb urine dipstick MODERATE (A) NEGATIVE   Bilirubin Urine NEGATIVE NEGATIVE   Ketones, ur NEGATIVE NEGATIVE mg/dL   Protein, ur NEGATIVE NEGATIVE mg/dL   Nitrite NEGATIVE NEGATIVE   Leukocytes, UA NEGATIVE NEGATIVE   RBC / HPF 0-5 0 - 5 RBC/hpf   WBC, UA 0-5 0 - 5 WBC/hpf   Bacteria, UA NONE SEEN NONE SEEN   Squamous Epithelial / LPF 0-5 (A)  NONE SEEN   Mucous PRESENT   Wet prep, genital     Status: Abnormal   Collection Time: 01/14/17  5:30 PM  Result Value Ref Range   Yeast Wet Prep HPF POC NONE SEEN NONE SEEN   Trich, Wet Prep NONE SEEN NONE SEEN   Clue Cells Wet Prep HPF POC NONE SEEN NONE SEEN   WBC, Wet Prep HPF POC MODERATE (A) NONE SEEN   Sperm NONE SEEN   CBC     Status: Abnormal   Collection Time: 01/14/17  5:58 PM  Result Value Ref Range   WBC 10.1 4.0 - 10.5 K/uL   RBC 3.88 3.87 - 5.11 MIL/uL   Hemoglobin 11.0 (L) 12.0 - 15.0 g/dL   HCT 16.133.0 (L) 09.636.0 - 04.546.0 %   MCV 85.1 78.0 - 100.0 fL   MCH 28.4 26.0 - 34.0 pg   MCHC 33.3 30.0 - 36.0 g/dL   RDW 40.913.7 81.111.5 - 91.415.5 %   Platelets 246 150 - 400 K/uL  ABO/Rh     Status: None (Preliminary result)   Collection Time: 01/14/17  5:58 PM  Result Value Ref Range   ABO/RH(D) O POS     --/--/O POS (08/05 1758)  IMAGING No results found.  MAU Management/MDM: Ordered labs and reviewed results.  US done to evaluate placenta and cervical length, both wnl.  No evidence of preterm labor today.   Likely cervical bleeding.  Consult Dr Senaida Oresichardson with assessment and findings. Pelvic rest x 2 weeks, f/u in office as scheduled, return to MAU if bleeding worsens or persists or if it is associated with abdominal pain.  Pt stable at time of discharge.  ASSESSMENT 1. Postcoital bleeding   2. Vaginal bleeding in pregnancy, second trimester     PLAN Discharge home with bleeding precautions  Allergies as of 01/14/2017   No Known Allergies     Medication List    STOP taking these medications   Norethin Ace-Eth Estrad-FE 1-20 MG-MCG(24) Chew   oxymetazoline 0.05 % nasal spray Commonly known as:  AFRIN NASAL SPRAY   pseudoephedrine 120 MG 12 hr tablet Commonly known as:  SUDAFED 12 HOUR   triamcinolone 55 MCG/ACT Aero nasal inhaler Commonly known as:  NASACORT AQ     TAKE these medications   cetirizine 10 MG tablet Commonly known as:  ZYRTEC Take 1 tablet (10 mg  total) by mouth daily.      Follow-up Information    Huel Coteichardson, Kathy, MD Follow up.   Specialty:  Obstetrics and Gynecology Why:  As scheduled on Wednesday, return to MAU as needed for emergencies Contact information: [address] [place] [address] [phone]           Sharen CounterLisa Leftwich-Kirby Certified Nurse-Midwife 01/14/2017  7:31 PM

## 2017-03-06 DIAGNOSIS — Z3A28 28 weeks gestation of pregnancy: Secondary | ICD-10-CM | POA: Diagnosis not present

## 2017-03-06 DIAGNOSIS — Z23 Encounter for immunization: Secondary | ICD-10-CM | POA: Diagnosis not present

## 2017-03-06 DIAGNOSIS — Z3689 Encounter for other specified antenatal screening: Secondary | ICD-10-CM | POA: Diagnosis not present

## 2017-03-27 DIAGNOSIS — O3433 Maternal care for cervical incompetence, third trimester: Secondary | ICD-10-CM | POA: Diagnosis not present

## 2017-03-27 DIAGNOSIS — Z3A31 31 weeks gestation of pregnancy: Secondary | ICD-10-CM | POA: Diagnosis not present

## 2017-03-27 DIAGNOSIS — O365931 Maternal care for other known or suspected poor fetal growth, third trimester, fetus 1: Secondary | ICD-10-CM | POA: Diagnosis not present

## 2017-04-04 DIAGNOSIS — Z23 Encounter for immunization: Secondary | ICD-10-CM | POA: Diagnosis not present

## 2017-04-04 DIAGNOSIS — Z3A32 32 weeks gestation of pregnancy: Secondary | ICD-10-CM | POA: Diagnosis not present

## 2017-04-24 DIAGNOSIS — O365931 Maternal care for other known or suspected poor fetal growth, third trimester, fetus 1: Secondary | ICD-10-CM | POA: Diagnosis not present

## 2017-04-24 DIAGNOSIS — Z3A35 35 weeks gestation of pregnancy: Secondary | ICD-10-CM | POA: Diagnosis not present

## 2017-05-02 DIAGNOSIS — Z3A36 36 weeks gestation of pregnancy: Secondary | ICD-10-CM | POA: Diagnosis not present

## 2017-05-02 DIAGNOSIS — Z3685 Encounter for antenatal screening for Streptococcus B: Secondary | ICD-10-CM | POA: Diagnosis not present

## 2017-05-02 DIAGNOSIS — Z3483 Encounter for supervision of other normal pregnancy, third trimester: Secondary | ICD-10-CM | POA: Diagnosis not present

## 2017-05-14 ENCOUNTER — Ambulatory Visit (INDEPENDENT_AMBULATORY_CARE_PROVIDER_SITE_OTHER): Payer: Self-pay | Admitting: Pediatrics

## 2017-05-14 DIAGNOSIS — Z7681 Expectant parent(s) prebirth pediatrician visit: Secondary | ICD-10-CM

## 2017-05-14 NOTE — Progress Notes (Signed)
Prenatal counseling for impending newborn done. Prenatal care started around 5-8 weeks. Mom denied any chronic health conditions or pregnancy complications. [redacted] weeks GA at time of visit.

## 2017-05-23 ENCOUNTER — Encounter (HOSPITAL_COMMUNITY): Payer: Self-pay | Admitting: *Deleted

## 2017-05-23 ENCOUNTER — Inpatient Hospital Stay (HOSPITAL_COMMUNITY): Payer: BLUE CROSS/BLUE SHIELD | Admitting: Anesthesiology

## 2017-05-23 ENCOUNTER — Inpatient Hospital Stay (HOSPITAL_COMMUNITY)
Admission: AD | Admit: 2017-05-23 | Discharge: 2017-05-26 | DRG: 807 | Disposition: A | Discharge status: Home / Self Care | Payer: BLUE CROSS/BLUE SHIELD | Source: Ambulatory Visit | Attending: Obstetrics and Gynecology | Admitting: Obstetrics and Gynecology

## 2017-05-23 ENCOUNTER — Other Ambulatory Visit: Payer: Self-pay

## 2017-05-23 DIAGNOSIS — Z3A39 39 weeks gestation of pregnancy: Secondary | ICD-10-CM

## 2017-05-23 DIAGNOSIS — Z3483 Encounter for supervision of other normal pregnancy, third trimester: Secondary | ICD-10-CM | POA: Diagnosis not present

## 2017-05-23 LAB — CBC
HEMATOCRIT: 38 % (ref 36.0–46.0)
Hemoglobin: 12.5 g/dL (ref 12.0–15.0)
MCH: 29 pg (ref 26.0–34.0)
MCHC: 32.9 g/dL (ref 30.0–36.0)
MCV: 88.2 fL (ref 78.0–100.0)
Platelets: 197 10*3/uL (ref 150–400)
RBC: 4.31 MIL/uL (ref 3.87–5.11)
RDW: 13.5 % (ref 11.5–15.5)
WBC: 10.7 10*3/uL — ABNORMAL HIGH (ref 4.0–10.5)

## 2017-05-23 LAB — TYPE AND SCREEN
ABO/RH(D): O POS
ANTIBODY SCREEN: NEGATIVE

## 2017-05-23 MED ORDER — ONDANSETRON HCL 4 MG/2ML IJ SOLN: 4.0000 mg | Freq: Four times a day (QID) | INTRAMUSCULAR | Status: DC | PRN

## 2017-05-23 MED ORDER — BUTORPHANOL TARTRATE 1 MG/ML IJ SOLN: 1.0000 mg | INTRAMUSCULAR | Status: DC | PRN

## 2017-05-23 MED ORDER — ACETAMINOPHEN 325 MG PO TABS: 650.0000 mg | ORAL_TABLET | ORAL | Status: DC | PRN

## 2017-05-23 MED ORDER — OXYTOCIN BOLUS FROM INFUSION
500.0000 mL | Freq: Once | INTRAVENOUS | Status: AC
  Administered 2017-05-24: 500 mL via INTRAVENOUS

## 2017-05-23 MED ORDER — OXYCODONE-ACETAMINOPHEN 5-325 MG PO TABS: 1.0000 | ORAL_TABLET | ORAL | Status: DC | PRN

## 2017-05-23 MED ORDER — LACTATED RINGERS IV SOLN: 500.0000 mL | INTRAVENOUS | Status: DC | PRN

## 2017-05-23 MED ORDER — EPHEDRINE 5 MG/ML INJ
10.0000 mg | INTRAVENOUS | Status: DC | PRN
  Filled 2017-05-23: qty 2

## 2017-05-23 MED ORDER — PHENYLEPHRINE 40 MCG/ML (10ML) SYRINGE FOR IV PUSH (FOR BLOOD PRESSURE SUPPORT)
80.0000 ug | PREFILLED_SYRINGE | INTRAVENOUS | Status: DC | PRN
  Filled 2017-05-23: qty 5

## 2017-05-23 MED ORDER — FENTANYL 2.5 MCG/ML BUPIVACAINE 1/10 % EPIDURAL INFUSION (WH - ANES)
14.0000 mL/h | INTRAMUSCULAR | Status: DC | PRN
  Administered 2017-05-23: 14 mL/h via EPIDURAL
  Filled 2017-05-23: qty 100

## 2017-05-23 MED ORDER — PHENYLEPHRINE 40 MCG/ML (10ML) SYRINGE FOR IV PUSH (FOR BLOOD PRESSURE SUPPORT)
80.0000 ug | PREFILLED_SYRINGE | INTRAVENOUS | Status: DC | PRN
  Filled 2017-05-23: qty 5
  Filled 2017-05-23: qty 10

## 2017-05-23 MED ORDER — LIDOCAINE HCL (PF) 1 % IJ SOLN
30.0000 mL | INTRAMUSCULAR | Status: DC | PRN
  Filled 2017-05-23: qty 30

## 2017-05-23 MED ORDER — DIPHENHYDRAMINE HCL 50 MG/ML IJ SOLN: 12.5000 mg | INTRAMUSCULAR | Status: DC | PRN

## 2017-05-23 MED ORDER — OXYTOCIN 40 UNITS IN LACTATED RINGERS INFUSION - SIMPLE MED
2.5000 [IU]/h | INTRAVENOUS | Status: DC
  Filled 2017-05-23: qty 1000

## 2017-05-23 MED ORDER — LACTATED RINGERS IV SOLN
INTRAVENOUS | Status: DC
  Administered 2017-05-23: 22:00:00 via INTRAVENOUS

## 2017-05-23 MED ORDER — LACTATED RINGERS IV SOLN
500.0000 mL | Freq: Once | INTRAVENOUS | Status: AC
  Administered 2017-05-23: 500 mL via INTRAVENOUS

## 2017-05-23 MED ORDER — SOD CITRATE-CITRIC ACID 500-334 MG/5ML PO SOLN: 30.0000 mL | ORAL | Status: DC | PRN

## 2017-05-23 MED ORDER — LIDOCAINE HCL (PF) 1 % IJ SOLN
INTRAMUSCULAR | Status: DC | PRN
  Administered 2017-05-23: 4 mL
  Administered 2017-05-23: 6 mL via EPIDURAL

## 2017-05-23 MED ORDER — OXYCODONE-ACETAMINOPHEN 5-325 MG PO TABS: 2.0000 | ORAL_TABLET | ORAL | Status: DC | PRN

## 2017-05-23 NOTE — Anesthesia Procedure Notes (Signed)
Epidural Patient location during procedure: OB  Staffing Anesthesiologist: Sherhonda Gaspar, MD  Preanesthetic Checklist Completed: patient identified, pre-op evaluation, timeout performed, IV checked, risks and benefits discussed and monitors and equipment checked  Epidural Patient position: sitting Prep: DuraPrep Patient monitoring: blood pressure and continuous pulse ox Approach: right paramedian Location: L3-L4 Injection technique: LOR air  Needle:  Needle type: Tuohy  Needle gauge: 17 G Needle insertion depth: 5 cm Catheter type: closed end flexible Catheter size: 19 Gauge Catheter at skin depth: 10 cm Test dose: negative  Assessment Sensory level: T8  Additional Notes   Dosing of Epidural:  1st dose, through catheter .............................................  Xylocaine 40 mg  2nd dose, through catheter, after waiting 3 minutes.........Xylocaine 60 mg    As each dose occurred, patient was free of IV sx; and patient exhibited no evidence of SA injection.  Patient is more comfortable after epidural dosed. Please see RN's note for documentation of vital signs,and FHR which are stable.  Patient reminded not to try to ambulate with numb legs, and that an RN must be present when she attempts to get up.          

## 2017-05-23 NOTE — MAU Note (Signed)
Cntrx started about 1700. 3-5 mins apart; Denies leaking or bleeding. + FM but less in past few hours.

## 2017-05-23 NOTE — Anesthesia Pain Management Evaluation Note (Signed)
  CRNA Pain Management Visit Note  Patient: Shannon Fischer, 21 y.o., female  "Hello I am a member of the anesthesia team at [facility]. We have an anesthesia team available at all times to provide care throughout the hospital, including epidural management and anesthesia for C-section. I don't know your plan for the delivery whether it a natural birth, water birth, IV sedation, nitrous supplementation, doula or epidural, but we want to meet your pain goals."   1.Was your pain managed to your expectations on prior hospitalizations?   No prior hospitalizations  2.What is your expectation for pain management during this hospitalization?     Epidural, IV pain meds and Nitrous Oxide  3.How can we help you reach that goal? Be available  Record the patient's initial score and the patient's pain goal.   Pain: 4  Pain Goal: 5 [facility] wants you to be able to say your pain was always managed very well.  [facility] CenterMERRITT,Shannon Fischer 05/23/2017

## 2017-05-23 NOTE — H&P (Signed)
Shannon Fischer is a 21 y.o. female G1P0 at 2239 2/7 weeks (EDD 05/28/17 by 9 week US inconsistent with LMP) presenting for contractions.  Prenatal care complicated by size<dates with most recent US 04/24/17 with EFW 13%ile and AFI and dopplers normal.   OB History    Gravida Para Term Preterm AB Living   2       1     SAB TAB Ectopic Multiple Live Births   1            SAB x 1  Past Medical History:  Diagnosis Date  . Allergy   . Medical history non-contributory    Past Surgical History:  Procedure Laterality Date  . NO PAST SURGERIES     Family History: family history includes Cancer in her maternal grandfather and paternal grandmother; Diabetes in her paternal grandfather. Social History:  reports that  has never smoked. she has never used smokeless tobacco. She reports that she does not drink alcohol or use drugs.     Maternal Diabetes: No Genetic Screening: Normal Maternal Ultrasounds/Referrals: Normal Fetal Ultrasounds or other Referrals:  None Maternal Substance Abuse:  No Significant Maternal Medications:  None Significant Maternal Lab Results:  None Other Comments:  None  Review of Systems  Gastrointestinal: Positive for abdominal pain and diarrhea.   Maternal Medical History:  Reason for admission: Contractions.   Contractions: Onset was 1-2 hours ago.   Frequency: regular.   Perceived severity is moderate.    Fetal activity: Perceived fetal activity is normal.    Prenatal Complications - Diabetes: none.      Last menstrual period 08/16/2016. Maternal Exam:  Uterine Assessment: Contraction strength is moderate.  Contraction frequency is regular.   Abdomen: Patient reports no abdominal tenderness. Fetal presentation: vertex  Introitus: Normal vulva. Normal vagina.    FHR Category 1  Physical Exam  Constitutional: She appears well-developed.  Cardiovascular: Normal rate and regular rhythm.  Respiratory: Effort normal.  GI: Soft.  Genitourinary:  Vagina normal.  Neurological: She is alert.  Psychiatric: She has a normal mood and affect.    Prenatal labs: ABO, Rh: --/--/O POS (08/05 1758) Antibody:  neg Rubella:  Immune RPR:   NR HBsAg:   neg HIV:   NR GBS:   neg Essential panel negative Hgb AA First trimester screen negative One hour GCT 55  Assessment/Plan: Pt for labor evaluation with cervical change to 6 cm and to be admitted.  Epidural for pain.   Oliver PilaKathy W Khalani Novoa 05/23/2017, 8:39 PM

## 2017-05-23 NOTE — Anesthesia Preprocedure Evaluation (Signed)
Anesthesia Evaluation  Patient identified by MRN, date of birth, ID band Patient awake    Reviewed: Allergy & Precautions, H&P , Patient's Chart, lab work & pertinent test results  Airway Mallampati: II  TM Distance: >3 FB Neck ROM: full    Dental no notable dental hx.    Pulmonary    Pulmonary exam normal breath sounds clear to auscultation       Cardiovascular Exercise Tolerance: Good  Rhythm:regular Rate:Normal     Neuro/Psych    GI/Hepatic   Endo/Other    Renal/GU      Musculoskeletal   Abdominal   Peds  Hematology   Anesthesia Other Findings   Reproductive/Obstetrics                             Anesthesia Physical  Anesthesia Plan  ASA: II  Anesthesia Plan: Epidural   Post-op Pain Management:    Induction:   PONV Risk Score and Plan:   Airway Management Planned:   Additional Equipment:   Intra-op Plan:   Post-operative Plan:   Informed Consent: I have reviewed the patients History and Physical, chart, labs and discussed the procedure including the risks, benefits and alternatives for the proposed anesthesia with the patient or authorized representative who has indicated his/her understanding and acceptance.     Dental Advisory Given  Plan Discussed with:   Anesthesia Plan Comments: (Labs checked- platelets confirmed with RN in room. Fetal heart tracing, per RN, reported to be stable enough for sitting procedure. Discussed epidural, and patient consents to the procedure:  included risk of possible headache,backache, failed block, allergic reaction, and nerve injury. This patient was asked if she had any questions or concerns before the procedure started.)        Anesthesia Quick Evaluation  

## 2017-05-24 ENCOUNTER — Encounter (HOSPITAL_COMMUNITY): Payer: Self-pay | Admitting: *Deleted

## 2017-05-24 LAB — RPR: RPR: NONREACTIVE

## 2017-05-24 LAB — CBC
HCT: 33.7 % — ABNORMAL LOW (ref 36.0–46.0)
Hemoglobin: 10.9 g/dL — ABNORMAL LOW (ref 12.0–15.0)
MCH: 28.2 pg (ref 26.0–34.0)
MCHC: 32.3 g/dL (ref 30.0–36.0)
MCV: 87.3 fL (ref 78.0–100.0)
PLATELETS: 166 10*3/uL (ref 150–400)
RBC: 3.86 MIL/uL — AB (ref 3.87–5.11)
RDW: 13.8 % (ref 11.5–15.5)
WBC: 12 10*3/uL — AB (ref 4.0–10.5)

## 2017-05-24 MED ORDER — DIBUCAINE 1 % RE OINT: 1.0000 "application " | TOPICAL_OINTMENT | RECTAL | Status: DC | PRN

## 2017-05-24 MED ORDER — ZOLPIDEM TARTRATE 5 MG PO TABS: 5.0000 mg | ORAL_TABLET | Freq: Every evening | ORAL | Status: DC | PRN

## 2017-05-24 MED ORDER — SIMETHICONE 80 MG PO CHEW: 80.0000 mg | CHEWABLE_TABLET | ORAL | Status: DC | PRN

## 2017-05-24 MED ORDER — ONDANSETRON HCL 4 MG PO TABS: 4.0000 mg | ORAL_TABLET | ORAL | Status: DC | PRN

## 2017-05-24 MED ORDER — PRENATAL MULTIVITAMIN CH
1.0000 | ORAL_TABLET | Freq: Every day | ORAL | Status: DC
  Administered 2017-05-24 – 2017-05-26 (×3): 1 via ORAL
  Filled 2017-05-24 (×3): qty 1

## 2017-05-24 MED ORDER — IBUPROFEN 600 MG PO TABS
600.0000 mg | ORAL_TABLET | Freq: Four times a day (QID) | ORAL | Status: DC
  Administered 2017-05-24 – 2017-05-26 (×9): 600 mg via ORAL
  Filled 2017-05-24 (×9): qty 1

## 2017-05-24 MED ORDER — WITCH HAZEL-GLYCERIN EX PADS: 1.0000 "application " | MEDICATED_PAD | CUTANEOUS | Status: DC | PRN

## 2017-05-24 MED ORDER — ACETAMINOPHEN 325 MG PO TABS: 650.0000 mg | ORAL_TABLET | ORAL | Status: DC | PRN

## 2017-05-24 MED ORDER — ONDANSETRON HCL 4 MG/2ML IJ SOLN: 4.0000 mg | INTRAMUSCULAR | Status: DC | PRN

## 2017-05-24 MED ORDER — TETANUS-DIPHTH-ACELL PERTUSSIS 5-2.5-18.5 LF-MCG/0.5 IM SUSP: 0.5000 mL | Freq: Once | INTRAMUSCULAR | Status: DC

## 2017-05-24 MED ORDER — COCONUT OIL OIL
1.0000 "application " | TOPICAL_OIL | Status: DC | PRN
  Administered 2017-05-25: 1 via TOPICAL
  Filled 2017-05-24: qty 120

## 2017-05-24 MED ORDER — BENZOCAINE-MENTHOL 20-0.5 % EX AERO
1.0000 "application " | INHALATION_SPRAY | CUTANEOUS | Status: DC | PRN
  Administered 2017-05-24: 1 via TOPICAL
  Filled 2017-05-24: qty 56

## 2017-05-24 MED ORDER — OXYCODONE HCL 5 MG PO TABS: 10.0000 mg | ORAL_TABLET | ORAL | Status: DC | PRN

## 2017-05-24 MED ORDER — OXYCODONE HCL 5 MG PO TABS: 5.0000 mg | ORAL_TABLET | ORAL | Status: DC | PRN

## 2017-05-24 MED ORDER — DIPHENHYDRAMINE HCL 25 MG PO CAPS: 25.0000 mg | ORAL_CAPSULE | Freq: Four times a day (QID) | ORAL | Status: DC | PRN

## 2017-05-24 MED ORDER — SENNOSIDES-DOCUSATE SODIUM 8.6-50 MG PO TABS
2.0000 | ORAL_TABLET | ORAL | Status: DC
  Administered 2017-05-24 – 2017-05-25 (×2): 2 via ORAL
  Filled 2017-05-24 (×2): qty 2

## 2017-05-24 NOTE — Progress Notes (Signed)
Patient ID: Shannon Fischer, female   DOB: 1996/03/16, 21 y.o.   MRN: [id number] Pt admitted and received epidural at 8cm, now comfortable afeb VSS FHR category 1 with occasional mild variables  Cervix c/c/0 to +1 AROM clear  Will allow vertex to descend before beginning to push.

## 2017-05-24 NOTE — Plan of Care (Signed)
Progressing appropriately. Expresses increased comfort with breastfeeding process.

## 2017-05-24 NOTE — Anesthesia Postprocedure Evaluation (Signed)
Anesthesia Post Note  Patient: Shannon Fischer  Procedure(s) Performed: AN AD HOC LABOR EPIDURAL     Patient location during evaluation: Mother Baby Anesthesia Type: Epidural Level of consciousness: awake and alert and oriented Pain management: pain level controlled Vital Signs Assessment: post-procedure vital signs reviewed and stable Respiratory status: spontaneous breathing and nonlabored ventilation Cardiovascular status: stable Postop Assessment: no headache, adequate PO intake, no backache, epidural receding, patient able to bend at knees and no apparent nausea or vomiting Anesthetic complications: no    Last Vitals:  Vitals:   05/24/17 0420 05/24/17 0830  BP: (!) 109/59 114/67  Pulse: 84 75  Resp: 18 17  Temp: 36.8 C 36.8 C  SpO2:      Last Pain:  Vitals:   05/24/17 0830  TempSrc: Oral  PainSc: 2    Pain Goal: Patients Stated Pain Goal: 5 (05/23/17 2255)               Laban EmperorMalinova,Shannon Fischer

## 2017-05-24 NOTE — Progress Notes (Signed)
PPD #0 No problems Afeb, VSS Fundus firm, NT at U-1 Continue routine postpartum care 

## 2017-05-25 LAB — BIRTH TISSUE RECOVERY COLLECTION (PLACENTA DONATION)

## 2017-05-25 NOTE — Lactation Note (Signed)
This note was copied from a baby's chart. Lactation Consultation Note Baby 3429 hrs old. Mom stated baby is cluster feeding. Mom holding baby STS. Mom has everted nipples, hand expressed colostrum easily. Encouraged to assess breast before and after BF for transfer. Mom stated baby has spit up a few times after delivery. Baby wasn't interested in BF the first day, now is cluster feeding. Newborn behavior, feeding habits, I&O, supply and demand discussed. Mom encouraged to feed baby 8-12 times/24 hours and with feeding cues. Wake baby if hasn't cued in 3 hrs for feeding. Mom pleasant, had been worried 1st day when baby not BF, states going well at this time.  Encouraged mom to call for assistance or questions.  [facility]/LC brochure given w/resources, support groups and LC services.  Patient Name: Girl Thompson CaulBriana Heimsoth ZOXWR'UToday's Date: 05/25/2017 Reason for consult: Initial assessment   Maternal Data Has patient been taught Hand Expression?: Yes  Feeding Feeding Type: Breast Fed  LATCH Score Latch: Repeated attempts needed to sustain latch, nipple held in mouth throughout feeding, stimulation needed to elicit sucking reflex.  Audible Swallowing: Spontaneous and intermittent  Type of Nipple: Everted at rest and after stimulation  Comfort (Breast/Nipple): Soft / non-tender  Hold (Positioning): Assistance needed to correctly position infant at breast and maintain latch.  LATCH Score: 8  Interventions Interventions: Breast feeding basics reviewed;Breast compression;Skin to skin;Support pillows;Breast massage;Position options;Hand express  Lactation Tools Discussed/Used WIC Program: Yes   Consult Status Consult Status: Follow-up Date: 05/26/17 Follow-up type: In-patient    Charyl DancerCARVER, Mina Carlisi G 05/25/2017, 6:52 AM

## 2017-05-25 NOTE — Progress Notes (Signed)
Post Partum Day 1 Subjective: no complaints, up ad lib, voiding, tolerating PO and nl lochia, pain controlled  Objective: Blood pressure 118/76, pulse 77, temperature 97.8 F (36.6 C), temperature source Oral, resp. rate 18, height 5\' 6"  (1.676 m), weight 79.8 kg (176 lb), last menstrual period 08/16/2016, SpO2 99 %, unknown if currently breastfeeding.  Physical Exam:  General: alert, cooperative and no distress Lochia: appropriate Uterine Fundus: firm  Recent Labs    05/23/17 2208 05/24/17 0629  HGB 12.5 10.9*  HCT 38.0 33.7*    Assessment/Plan: Plan for discharge tomorrow, Breastfeeding and Lactation consult.  Routine care.     LOS: 2 days   Enzo Treu Bovard-Stuckert 05/25/2017, 7:16 AM

## 2017-05-26 MED ORDER — IBUPROFEN 600 MG PO TABS: 600.0000 mg | ORAL_TABLET | Freq: Four times a day (QID) | ORAL | 1 refills | Status: AC | PRN

## 2017-05-26 MED ORDER — PRENATAL MULTIVITAMIN CH: 1.0000 | ORAL_TABLET | Freq: Every day | ORAL | 3 refills | Status: AC

## 2017-05-26 NOTE — Lactation Note (Signed)
This note was copied from a baby's chart. Lactation Consultation Note: Mother reports that infant is feeding well. Mothers breast are filling and she can easily express milk. Mother was given a harmony hand pump with instructions to pre-pump as needed. Mother was fit with a #24 flange. Mother has a PIS advance from her insurance company.  Mother advised to continue to do skin to skin with each feeding. Advised to breastfeed on cue and to breastfeed at least 8-12 times in 24 hours. Discussed cluster feeding and feeding infant with all feeding cues. Discussed treatment and prevention of engorgement. Advised mother [facility]. Informed mother of BFSG'S and LC phone service for breastfeeding questions and concerns.   Patient Name: Shannon Thompson CaulBriana Fischer [id number]'XToday's Date: 05/26/2017 Reason for consult: Follow-up assessment   Maternal Data    Feeding    LATCH Score                   Interventions    Lactation Tools Discussed/Used     Consult Status Consult Status: Complete    Shannon BickersKendrick, Shannon Fischer 05/26/2017, 12:12 PM

## 2017-05-26 NOTE — Discharge Summary (Signed)
OB Discharge Summary     Patient Name: Shannon Fischer DOB: 02-18-1996 MRN: [id number]  Date of admission: 05/23/2017 Delivering MD: Huel CoteICHARDSON, KATHY   Date of discharge: 05/26/2017  Admitting diagnosis: CTX Intrauterine pregnancy: 3245w3d     Secondary diagnosis:  Active Problems:   Indication for care in labor and delivery, antepartum   NSVD (normal spontaneous vaginal delivery)  Additional problems: N/A     Discharge diagnosis: Term Pregnancy Delivered                                                                                                Post partum procedures:N/A  Augmentation: AROM and Pitocin  Complications: None  Hospital course:  Onset of Labor With Vaginal Delivery     21 y.o. yo G2P1011 at 3745w3d was admitted in Active Labor on 05/23/2017. Patient had an uncomplicated labor course as follows:  Membrane Rupture Time/Date: 11:58 PM ,05/23/2017   Intrapartum Procedures: Episiotomy: None [1]                                         Lacerations:  Periurethral [8]  Patient had a delivery of a Viable infant. 05/24/2017  Information for the patient's newborn:  Garnetta BuddyHoward, Girl Aaleigha [[id number]][[id number]]  Delivery Method: Vag-Spont    Pateint had an uncomplicated postpartum course.  She is ambulating, tolerating a regular diet, passing flatus, and urinating well. Patient is discharged home in stable condition on 05/26/17.   Physical exam  Vitals:   05/24/17 0830 05/25/17 0550 05/25/17 1900 05/26/17 0619  BP: 114/67 118/76 112/64 (!) 112/59  Pulse: 75 77 93 69  Resp: 17 18 20 18   Temp: 98.2 F (36.8 C) 97.8 F (36.6 C) 98.3 F (36.8 C) 98.2 F (36.8 C)  TempSrc: Oral Oral Oral Oral  SpO2:    100%  Weight:      Height:       General: alert and no distress Lochia: appropriate Uterine Fundus: firm  Labs: Lab Results  Component Value Date   WBC 12.0 (H) 05/24/2017   HGB 10.9 (L) 05/24/2017   HCT 33.7 (L) 05/24/2017   MCV 87.3 05/24/2017   PLT 166 05/24/2017    No flowsheet data found.  Discharge instruction: per After Visit Summary and "Baby and Me Booklet".  After visit meds:  Allergies as of 05/26/2017   No Known Allergies     Medication List    STOP taking these medications   ferrous sulfate 325 (65 FE) MG tablet     TAKE these medications   ibuprofen 600 MG tablet Commonly known as:  ADVIL,MOTRIN Take 1 tablet (600 mg total) by mouth every 6 (six) hours as needed.   prenatal multivitamin Tabs tablet Take 1 tablet by mouth daily at 12 noon.       Diet: routine diet  Activity: Advance as tolerated. Pelvic rest for 6 weeks.   Outpatient follow up:6 weeks Follow up Appt:No future appointments. Follow up Visit:No Follow-up on file.  Postpartum contraception: Undecided  Newborn Data: Live born female  Birth Weight: 6 lb 7.2 oz (2926 g) APGAR: 9, 9  Newborn Delivery   Birth date/time:  05/24/2017 01:16:00 Delivery type:  Vaginal, Spontaneous     Baby Feeding: Breast Disposition:home with mother   05/26/2017 Sherian ReinJody Bovard-Stuckert, MD

## 2017-05-26 NOTE — Progress Notes (Addendum)
Post Partum Day 2 Subjective: no complaints, up ad lib, voiding, tolerating PO and nl lochia, pain controlled  Objective: Blood pressure (!) 112/59, pulse 69, temperature 98.2 F (36.8 C), temperature source Oral, resp. rate 18, height 5\' 6"  (1.676 m), weight 79.8 kg (176 lb), last menstrual period 08/16/2016, SpO2 100 %, unknown if currently breastfeeding.  Physical Exam:  General: alert and no distress Lochia: appropriate Uterine Fundus: firm   Recent Labs    05/23/17 2208 05/24/17 0629  HGB 12.5 10.9*  HCT 38.0 33.7*    Assessment/Plan: Discharge home, Breastfeeding and Lactation consult.  Routine PP care.  D/c with motrin and PNV.   LOS: 3 days   Illyana Schorsch Bovard-Stuckert 05/26/2017, 8:16 AM

## 2017-06-14 ENCOUNTER — Encounter: Payer: Self-pay | Admitting: Pediatrics

## 2017-07-04 DIAGNOSIS — Z1389 Encounter for screening for other disorder: Secondary | ICD-10-CM | POA: Diagnosis not present

## 2017-07-04 DIAGNOSIS — Z3009 Encounter for other general counseling and advice on contraception: Secondary | ICD-10-CM | POA: Diagnosis not present
